# Patient Record
Sex: Female | Born: 1975 | Race: White | Hispanic: No | Smoking: Former smoker
Health system: Southern US, Community
[De-identification: ages and names within clinical notes are randomized; demographics above are authoritative.]

## PROBLEM LIST (undated history)

## (undated) DIAGNOSIS — Z8669 Personal history of other diseases of the nervous system and sense organs: Secondary | ICD-10-CM

## (undated) DIAGNOSIS — M539 Dorsopathy, unspecified: Secondary | ICD-10-CM

## (undated) DIAGNOSIS — D473 Essential (hemorrhagic) thrombocythemia: Secondary | ICD-10-CM

## (undated) HISTORY — PX: ABDOMINAL SURGERY: SHX537

## (undated) HISTORY — PX: PARTIAL HYSTERECTOMY: SHX80

## (undated) HISTORY — PX: ABDOMINAL HYSTERECTOMY: SHX81

## (undated) HISTORY — PX: KNEE SURGERY: SHX244

## (undated) HISTORY — DX: Personal history of other diseases of the nervous system and sense organs: Z86.69

## (undated) HISTORY — PX: TUBAL LIGATION: SHX77

## (undated) HISTORY — PX: APPENDECTOMY: SHX54

## (undated) HISTORY — PX: BACK SURGERY: SHX140

---
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
## 2004-02-23 ENCOUNTER — Emergency Department (HOSPITAL_COMMUNITY): Admission: EM | Admit: 2004-02-23 | Discharge: 2004-02-23 | Payer: Self-pay | Admitting: Emergency Medicine

## 2004-03-04 ENCOUNTER — Emergency Department (HOSPITAL_COMMUNITY): Admission: EM | Admit: 2004-03-04 | Discharge: 2004-03-04 | Payer: Self-pay | Admitting: *Deleted

## 2004-03-07 ENCOUNTER — Ambulatory Visit (HOSPITAL_COMMUNITY): Admission: RE | Admit: 2004-03-07 | Discharge: 2004-03-07 | Payer: Self-pay | Admitting: Emergency Medicine

## 2004-03-16 ENCOUNTER — Emergency Department (HOSPITAL_COMMUNITY): Admission: EM | Admit: 2004-03-16 | Discharge: 2004-03-16 | Payer: Self-pay | Admitting: Emergency Medicine

## 2004-03-19 ENCOUNTER — Ambulatory Visit (HOSPITAL_COMMUNITY): Admission: AD | Admit: 2004-03-19 | Discharge: 2004-03-20 | Payer: Self-pay | Admitting: Neurosurgery

## 2005-01-03 ENCOUNTER — Ambulatory Visit: Payer: Self-pay | Admitting: Sports Medicine

## 2006-01-03 ENCOUNTER — Emergency Department (HOSPITAL_COMMUNITY): Admission: EM | Admit: 2006-01-03 | Discharge: 2006-01-03 | Payer: Self-pay | Admitting: Emergency Medicine

## 2006-01-04 ENCOUNTER — Ambulatory Visit: Payer: Self-pay | Admitting: Family Medicine

## 2006-01-06 ENCOUNTER — Ambulatory Visit: Payer: Self-pay | Admitting: Family Medicine

## 2006-01-10 ENCOUNTER — Encounter: Admission: RE | Admit: 2006-01-10 | Discharge: 2006-01-10 | Payer: Self-pay | Admitting: Sports Medicine

## 2006-01-12 ENCOUNTER — Ambulatory Visit: Payer: Self-pay | Admitting: Family Medicine

## 2006-03-07 ENCOUNTER — Inpatient Hospital Stay (HOSPITAL_COMMUNITY): Admission: AD | Admit: 2006-03-07 | Discharge: 2006-03-09 | Payer: Self-pay | Admitting: Neurosurgery

## 2006-11-09 DIAGNOSIS — M545 Low back pain, unspecified: Secondary | ICD-10-CM | POA: Insufficient documentation

## 2006-11-09 DIAGNOSIS — F17200 Nicotine dependence, unspecified, uncomplicated: Secondary | ICD-10-CM

## 2006-11-09 DIAGNOSIS — N920 Excessive and frequent menstruation with regular cycle: Secondary | ICD-10-CM

## 2008-05-13 ENCOUNTER — Emergency Department (HOSPITAL_COMMUNITY): Admission: EM | Admit: 2008-05-13 | Discharge: 2008-05-13 | Payer: Self-pay | Admitting: Emergency Medicine

## 2008-05-14 ENCOUNTER — Telehealth: Payer: Self-pay | Admitting: *Deleted

## 2008-05-16 ENCOUNTER — Encounter (INDEPENDENT_AMBULATORY_CARE_PROVIDER_SITE_OTHER): Payer: Self-pay | Admitting: Family Medicine

## 2008-05-16 ENCOUNTER — Ambulatory Visit: Payer: Self-pay | Admitting: Family Medicine

## 2008-05-21 ENCOUNTER — Encounter (INDEPENDENT_AMBULATORY_CARE_PROVIDER_SITE_OTHER): Payer: Self-pay | Admitting: Family Medicine

## 2008-05-21 ENCOUNTER — Encounter: Admission: RE | Admit: 2008-05-21 | Discharge: 2008-05-21 | Payer: Self-pay | Admitting: Family Medicine

## 2008-05-22 ENCOUNTER — Encounter: Payer: Self-pay | Admitting: *Deleted

## 2008-05-30 ENCOUNTER — Encounter (INDEPENDENT_AMBULATORY_CARE_PROVIDER_SITE_OTHER): Payer: Self-pay | Admitting: Family Medicine

## 2008-06-02 ENCOUNTER — Encounter: Admission: RE | Admit: 2008-06-02 | Discharge: 2008-06-02 | Payer: Self-pay | Admitting: Neurosurgery

## 2008-06-13 ENCOUNTER — Encounter: Admission: RE | Admit: 2008-06-13 | Discharge: 2008-06-13 | Payer: Self-pay | Admitting: Neurosurgery

## 2008-06-26 ENCOUNTER — Encounter: Admission: RE | Admit: 2008-06-26 | Discharge: 2008-06-26 | Payer: Self-pay | Admitting: Neurosurgery

## 2008-07-28 ENCOUNTER — Ambulatory Visit: Payer: Self-pay | Admitting: Family Medicine

## 2008-07-28 ENCOUNTER — Telehealth: Payer: Self-pay | Admitting: *Deleted

## 2008-07-28 ENCOUNTER — Encounter: Payer: Self-pay | Admitting: Family Medicine

## 2008-07-28 DIAGNOSIS — J069 Acute upper respiratory infection, unspecified: Secondary | ICD-10-CM | POA: Insufficient documentation

## 2008-07-28 DIAGNOSIS — H6120 Impacted cerumen, unspecified ear: Secondary | ICD-10-CM | POA: Insufficient documentation

## 2009-08-04 ENCOUNTER — Emergency Department (HOSPITAL_COMMUNITY): Admission: EM | Admit: 2009-08-04 | Discharge: 2009-08-04 | Payer: Self-pay | Admitting: Emergency Medicine

## 2009-11-10 ENCOUNTER — Encounter: Payer: Self-pay | Admitting: Family Medicine

## 2010-10-12 NOTE — Miscellaneous (Signed)
Summary: Clean up old acute medications  Clinical Lists Changes  Medications: Removed medication of HYDROCODONE-ACETAMINOPHEN 5-325 MG TABS (HYDROCODONE-ACETAMINOPHEN) Take 1-2 tabs every 4-6 hours as needed pain. Removed medication of IBU 800 MG TABS (IBUPROFEN) Take one tablet every 8 hours until seen by neurosugery. Removed medication of FLEXERIL 5 MG TABS (CYCLOBENZAPRINE HCL) Take 1-2 tablets every 8 hours as needed muscle spasm for muscle relaxation. Do not drive or do anything dangerous if taking this.

## 2011-01-28 NOTE — Op Note (Signed)
NAME:  Brandi Durham, Brandi Durham                          ACCOUNT NO.:  [id number]   MEDICAL RECORD NO.:  [id number]                   PATIENT TYPE:  OIB   LOCATION:  3015                                 FACILITY:  [facility]   PHYSICIAN:  Coletta Memos, M.D.                  DATE OF BIRTH:  Jun 19, 1976   DATE OF PROCEDURE:  03/19/2004  DATE OF DISCHARGE:  03/20/2004                                 OPERATIVE REPORT   PREOPERATIVE DIAGNOSIS:  Displaced disk, L5-S1; left S1 radiculopathy.   POSTOPERATIVE DIAGNOSIS:  Displaced disk, L5-S1; left S1 radiculopathy.   PROCEDURE:  Left L5-S1 diskectomy with microdissection.   COMPLICATIONS:  None.   SURGEON:  Coletta Memos, M.D.   ASSISTANT:  Hilda Lias, M.D.   INDICATIONS:  Brandi Durham presented to my office today for evaluation of  severe pain in the left lower extremity.  MRI showed a large herniated disk  at L5-S1.  I therefore recommended and she agreed to undergo operative  decompression.   OPERATIVE NOTE:  Brandi Durham was brought to the operating room, intubated and  placed under a general anesthetic without difficulty.  She was rolled prone  onto a Wilson frame and all pressure points were properly padded.  The skin  was prepped and she was draped in a sterile fashion.  She had a large tattoo  overlying the area of incision.  I placed a spinal needle to localize my  incision.  Once that was done, I infiltrated 0.5% lidocaine with 1:200,000-  strength epinephrine.  I opened the skin with a #10 blade and took this down  to the thoracolumbar fascia sharply.  I then exposed the lamina of what I  believed to be L5 and S1.  I took an x-ray and I used that to localize the  L5-S1 interspace.  I then removed the ligamentum flavum and performed a very  slight semi-hemilaminectomy using a Kerrison punch.  I retracted the thecal  sac medially, identified a disk herniation and brought in the microscope to  aid in microdissection.  With Dr. Cassandria Santee  assistance, we removed the disk  in a progressive fashion using pituitary rongeurs, curettes and Kerrison  punches.  After decompressing the nerve root to my satisfaction, I then  inspected the egress of the S1 nerve root,  rostrally, caudally, laterally  and medially.  I felt that there was no more pressure.  I then irrigated the  wound.  I then closed the wound in layered fashion using Vicryl sutures.  Dermabond was used for a sterile dressing.  Brandi Durham tolerated the  procedure well without difficulty, moving all extremities upon awakening.  Coletta Memos, M.D.    KC/MEDQ  D:  03/30/2004  T:  03/31/2004  Job:  [id number]

## 2011-01-28 NOTE — Discharge Summary (Signed)
[name], [name]                ACCOUNT NO.:  [id number]   MEDICAL RECORD NO.:  [id number]          PATIENT TYPE:  INP   LOCATION:  3004                         FACILITY:  [facility]   PHYSICIAN:  Coletta Memos, M.D.     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   PROCEDURE:  Left L5-S1 redo semi-hemilaminectomy and diskectomy with  microdissection.   COMPLICATIONS:  Dural tear.   DISCHARGE STATUS:  Alive and well.   INDICATIONS:  Brandi Durham is a patient of mine, who I had taken a disc out  of on the left side at L5-S1 in 2005.  She came back and had recurrent pain  in the left lower extremity.  MRI showed recurrent disc.  Her operation went  well.  She did have a dural tear.  Postoperatively she was flat in bed for 2  days.  She had no problems with bowel movements or with urination.  Her  wound was clean, flat and dry.  She was therefore discharged home on given  instructions.           ______________________________  Coletta Memos, M.D.     KC/MEDQ  D:  04/07/2006  T:  04/07/2006  Job:  [id number]

## 2011-01-28 NOTE — Op Note (Signed)
NAMECAMYA, HAYDON                ACCOUNT NO.:  [id number]   MEDICAL RECORD NO.:  [id number]          PATIENT TYPE:  OIB   LOCATION:  3172                         FACILITY:  [facility]   PHYSICIAN:  Coletta Memos, M.D.     DATE OF BIRTH:  03/04/1976   DATE OF PROCEDURE:  03/06/2006  DATE OF DISCHARGE:                                 OPERATIVE REPORT   PREOPERATIVE DIAGNOSIS:  1.  Recurrent disk herniation L5-S1.  2.  Left lower extremity numbness and pain.   POSTOPERATIVE DIAGNOSIS:  1.  Recurrent disk herniation L5-S1.  2.  Left lower extremity numbness and pain.   PROCEDURE:  Left L5-S1, semihemilaminectomy and diskectomy with  microdissection.   COMPLICATIONS:  Unintentional dural opening.   INDICATIONS:  Mrs. Marcine Katrinka Durham is a 35 year old who underwent a lumbar  procedure in the past approximately 2 years ago for herniated disk.  She has  done very well since that time but then started having pain a few months  ago.  As a result of that, she had a repeat MRI scan, and what the scan  showed was that she had recurrent disk at L5-S1.  She therefore wanted to  undergo decompression again.  I agreed and she is admitted today for  operation.   OPERATIVE NOTE:  Brandi Durham was brought to the operating room intubated and  placed under general anesthetic.  She was rolled prone onto a Wilson frame  and all pressure points were properly padded.  Back was prepped.  She was  draped in a sterile fashion.  I infiltrated lidocaine into the old incision  site.  I opened the incision with a #10 blade.  I took this down to the  thoracolumbar fascia.  I then exposed lamina of L5-S1.  I took an x-ray  after placing a double ended ganglion knife inferior to the superior lamina  and my exposure and I was at L5-S1.  I then used a curette to separate the  scar tissue from the lamina of L5.  I did that without great difficulty  initially.  I then placed a Kerrison punch and took a few bites and  everything was fine.  I then used a smaller punch to get underneath some  scar tissue and when I bit at that point time I had spinal fluid.  I was  able to see where an opening was in the scar tissue but I could not see any  dissection between scar and dura.  As that was high on the canal, I was able  to just place a flap of scar over that and continue working.  I eventually  dissected laterally using curved curettes and to drill along with Kerrison  punches until I thought I was at the disk space.  I therefore took another x-  ray ad it showed that my probe was pointing directly over the disk space.  I  used Penfield #4 to the disk space and then proceeded with a diskectomy.  I  removed a significant amount of material but the disk was degenerated.  When  I was done with the diskectomy using microdissection and with Dr. Verlee Rossetti  assistance, I inspected the nerve root on that side.  The S1 nerve root was  intact and felt decompressed.  Much of this was in the midline.  I then cut  a piece of paraspinous musculature, placed that over the opening into the  dura.  I then showed the flap down to the other scar tissue with the muscle  underneath covering the opening and secured that with 6-0 Prolene.  At that  point in time, I then used  Tisseel and covered that area, then I placed a piece of subcutaneous fat  over my repair site and then used more Tisseel.  At that point in time, I  closed the wound in layered fashion using Vicryl sutures.  Dermabond was  used for sterile dressing.  Brandi Durham tolerated the procedure well.           ______________________________  Coletta Memos, M.D.     KC/MEDQ  D:  03/06/2006  T:  03/06/2006  Job:  [id number]

## 2012-03-07 ENCOUNTER — Emergency Department (HOSPITAL_COMMUNITY)
Admission: EM | Admit: 2012-03-07 | Discharge: 2012-03-07 | Disposition: A | Discharge status: Home / Self Care | Payer: Self-pay | Attending: Emergency Medicine | Admitting: Emergency Medicine

## 2012-03-07 ENCOUNTER — Encounter (HOSPITAL_COMMUNITY): Payer: Self-pay | Admitting: Emergency Medicine

## 2012-03-07 DIAGNOSIS — M549 Dorsalgia, unspecified: Secondary | ICD-10-CM

## 2012-03-07 DIAGNOSIS — F17200 Nicotine dependence, unspecified, uncomplicated: Secondary | ICD-10-CM | POA: Insufficient documentation

## 2012-03-07 HISTORY — DX: Dorsopathy, unspecified: M53.9

## 2012-03-07 MED ORDER — OXYCODONE-ACETAMINOPHEN 5-325 MG PO TABS
1.0000 | ORAL_TABLET | Freq: Once | ORAL | Status: AC
  Administered 2012-03-07: 1 via ORAL
  Filled 2012-03-07: qty 1

## 2012-03-07 MED ORDER — PREDNISONE 20 MG PO TABS
60.0000 mg | ORAL_TABLET | Freq: Once | ORAL | Status: AC
  Administered 2012-03-07: 60 mg via ORAL
  Filled 2012-03-07: qty 3

## 2012-03-07 MED ORDER — OXYCODONE-ACETAMINOPHEN 5-325 MG PO TABS: 1.0000 | ORAL_TABLET | ORAL | Status: AC | PRN

## 2012-03-07 MED ORDER — PREDNISONE 50 MG PO TABS: 50.0000 mg | ORAL_TABLET | Freq: Every day | ORAL | Status: DC

## 2012-03-07 NOTE — ED Notes (Signed)
Pt reports recurrent low back pain Swollen tender area on spine in mid back

## 2012-03-07 NOTE — ED Provider Notes (Signed)
History     CSN: [id number]  Arrival date & time 03/07/12  1002   First MD Initiated Contact with Patient 03/07/12 1043      Chief Complaint  Patient presents with  . Back Pain    hx of recurrent low back pain  . Abscess    soft, swollen, area on mid back.  Approx. 2 cm diameter. Tenderness and pain x 2 weeks    (Consider location/radiation/quality/duration/timing/severity/associated sxs/prior treatment) Patient is a 36 y.o. female presenting with back pain. The history is provided by the patient. No language interpreter was used.  Back Pain  This is a chronic problem. The current episode started more than 1 week ago. The problem occurs constantly. The problem has been rapidly worsening. The pain is present in the thoracic spine and lumbar spine. The quality of the pain is described as aching. The pain radiates to the right thigh. The pain is at a severity of 8/10. The pain is severe. The symptoms are aggravated by bending and twisting. The pain is worse during the night (Affects sleep.). Associated symptoms include paresthesias. Pertinent negatives include no fever and no weakness. Associated symptoms comments: Paraesthesias affect the right leg.. She has tried muscle relaxants and NSAIDs (Muscle relaxants were given by her friend.) for the symptoms. The treatment provided mild relief.    Past Medical History  Diagnosis Date  . Back problem     Past Surgical History  Procedure Date  . Back surgery   . Abdominal surgery   . Knee surgery   . Cesarean section   . Abdominal hysterectomy   . Partial hysterectomy     Family History  Problem Relation Age of Onset  . Diabetes Mother   . Hypertension Mother   . Heart failure Father     History  Substance Use Topics  . Smoking status: Current Some Day Smoker  . Smokeless tobacco: Not on file  . Alcohol Use: Yes    OB History    Grav Para Term Preterm Abortions TAB SAB Ect Mult Living                  Review of Systems   Constitutional: Negative for fever and chills.  Genitourinary: Negative for difficulty urinating.       Negative for bowel or bladder incontinence.  Musculoskeletal: Positive for back pain.  Neurological: Positive for paresthesias. Negative for weakness.  Psychiatric/Behavioral: Positive for disturbed wake/sleep cycle.       Disturbance secondary to back pain.    Allergies  Penicillins and Sulfur  Home Medications   Current Outpatient Rx  Name Route Sig Dispense Refill  . IBUPROFEN 800 MG PO TABS Oral Take 800 mg by mouth every 4 (four) hours as needed. Back pain    . METHOCARBAMOL 500 MG PO TABS Oral Take 500 mg by mouth once. Back pain      BP 107/84  Pulse 65  Temp 98.1 F (36.7 C) (Oral)  Resp 18  SpO2 100%  LMP 03/06/2012  Physical Exam  Constitutional: She appears well-developed and well-nourished. She appears distressed.       Patient was tearful after questioning.  HENT:  Head: Normocephalic and atraumatic.  Neck: Normal range of motion. Neck supple.  Cardiovascular: Normal rate and regular rhythm.   Pulmonary/Chest: Effort normal and breath sounds normal.  Abdominal: Soft. There is no tenderness.  Musculoskeletal: She exhibits edema and tenderness.       Right shoulder: She exhibits tenderness, deformity and  pain.       Arms:      Tenderness on palpation of the cervical, thoracic, and lumbar spine. 2.5-3cm tender, non-indurated mass noted on the thoracic area of the spine.   Neurological: She has normal reflexes. She exhibits abnormal muscle tone.       Abnormal tone noted in the right foot and toes.    ED Course  Procedures (including critical care time)  Labs Reviewed - No data to display No results found.   1. Back pain       MDM  Chronic musculoskeletal pain exacerbation: Patient will be discharged on Percocet and Prednisone and follow-up with her neurosurgeon.No red flags to suggest cauda equina.  Thoracic mass/swelling needs to be  evaluated by PCP in the next week. No induration or fluctuation to suggest abscess.        Pixie Casino, PA-C 03/10/12 1054

## 2012-03-07 NOTE — Discharge Instructions (Signed)
Take Oxycodone and Prednisone as perscribed. Follow-up with Neurosurgeon for back pain. Follow-up with primary care about mass on spine. Do not take medications not prescribed by your physician.

## 2012-03-16 NOTE — ED Provider Notes (Signed)
Patient with soft tissue swelling midback without erythema or fluctuance most c.e. Lipoma, also complaining of low back pain with normal neuro exam.   History/physical exam/procedure(s) were performed by non-physician practitioner and as supervising physician I was immediately available for consultation/collaboration. I have reviewed all notes and am in agreement with care and plan.   Hilario Quarry, MD 03/16/12 0400

## 2012-04-18 ENCOUNTER — Encounter (HOSPITAL_COMMUNITY): Payer: Self-pay | Admitting: Emergency Medicine

## 2012-04-18 ENCOUNTER — Emergency Department (HOSPITAL_COMMUNITY)
Admission: EM | Admit: 2012-04-18 | Discharge: 2012-04-19 | Disposition: A | Discharge status: Home / Self Care | Payer: Self-pay | Attending: Emergency Medicine | Admitting: Emergency Medicine

## 2012-04-18 DIAGNOSIS — M549 Dorsalgia, unspecified: Secondary | ICD-10-CM | POA: Insufficient documentation

## 2012-04-18 DIAGNOSIS — F17200 Nicotine dependence, unspecified, uncomplicated: Secondary | ICD-10-CM | POA: Insufficient documentation

## 2012-04-18 NOTE — ED Notes (Signed)
Pt alert, arrives from home, c/o low back pain, onset was chronic in nature, recent injury at work, slip fall injury, resp even unlabored, skin pwd, ambulates to triage, steady gait noted

## 2012-04-19 MED ORDER — OXYCODONE-ACETAMINOPHEN 5-325 MG PO TABS: 1.0000 | ORAL_TABLET | Freq: Four times a day (QID) | ORAL | Status: AC | PRN

## 2012-04-19 MED ORDER — METHOCARBAMOL 500 MG PO TABS: 1000.0000 mg | ORAL_TABLET | Freq: Four times a day (QID) | ORAL | Status: DC

## 2012-04-19 MED ORDER — PREDNISONE 20 MG PO TABS: ORAL_TABLET | ORAL | Status: AC

## 2012-04-19 MED ORDER — IBUPROFEN 800 MG PO TABS: 800.0000 mg | ORAL_TABLET | Freq: Three times a day (TID) | ORAL | Status: AC | PRN

## 2012-04-19 MED ORDER — OXYCODONE-ACETAMINOPHEN 5-325 MG PO TABS
2.0000 | ORAL_TABLET | Freq: Once | ORAL | Status: AC
  Administered 2012-04-19: 2 via ORAL
  Filled 2012-04-19: qty 2

## 2012-04-19 NOTE — ED Provider Notes (Signed)
History     CSN: [id number]  Arrival date & time 04/18/12  2241   First MD Initiated Contact with Patient 04/18/12 2354      Chief Complaint  Patient presents with  . Back Pain    (Consider location/radiation/quality/duration/timing/severity/associated sxs/prior treatment) HPI Comments: History of chronic back pain, previous surgery by Dr. Franky Macho -- presents with flare of chronic pain since last week when she slipped and fell. Took percocet which helped her sleep. Tylenol and ibuprofen have not helped. Radiation of pain into R leg. No red flag s/s of low back pain. Onset was acute. Course is constant. Movement and certain positions make the pain worse. Percocet improves the pain mildly. Pain medication, NSAIDs, steroids have helped her symptoms in the past.   Patient is a 36 y.o. female presenting with back pain. The history is provided by the patient.  Back Pain  This is a chronic problem. The current episode started more than 2 days ago. The problem occurs constantly. The problem has not changed since onset.The pain is associated with falling. The pain is present in the lumbar spine. The quality of the pain is described as shooting. The pain radiates to the right thigh. The pain is moderate. The symptoms are aggravated by bending, twisting and certain positions. Associated symptoms include leg pain. Pertinent negatives include no fever, no numbness, no weight loss, no bowel incontinence, no bladder incontinence, no dysuria, no pelvic pain, no tingling and no weakness. She has tried analgesics and NSAIDs for the symptoms. The treatment provided mild relief.    Past Medical History  Diagnosis Date  . Back problem     Past Surgical History  Procedure Date  . Back surgery   . Abdominal surgery   . Knee surgery   . Cesarean section   . Abdominal hysterectomy   . Partial hysterectomy     Family History  Problem Relation Age of Onset  . Diabetes Mother   . Hypertension Mother   .  Heart failure Father     History  Substance Use Topics  . Smoking status: Current Some Day Smoker  . Smokeless tobacco: Not on file  . Alcohol Use: Yes    OB History    Grav Para Term Preterm Abortions TAB SAB Ect Mult Living                  Review of Systems  Constitutional: Negative for fever, weight loss and unexpected weight change.  Gastrointestinal: Negative for constipation and bowel incontinence.       Negative for fecal incontinence.   Genitourinary: Negative for bladder incontinence, dysuria, hematuria, flank pain, vaginal bleeding, vaginal discharge and pelvic pain.       Negative for urinary incontinence or retention.  Musculoskeletal: Positive for back pain.  Neurological: Negative for tingling, weakness and numbness.       Denies saddle paresthesias.    Allergies  Penicillins and Sulfur  Home Medications   Current Outpatient Rx  Name Route Sig Dispense Refill  . IBUPROFEN 800 MG PO TABS Oral Take 800 mg by mouth every 4 (four) hours as needed. Back pain      BP 111/73  Pulse 95  Temp 98.4 F (36.9 C) (Oral)  Resp 20  SpO2 96%  LMP 04/08/2012  Physical Exam  Nursing note and vitals reviewed. Constitutional: She appears well-developed and well-nourished.  HENT:  Head: Normocephalic and atraumatic.  Eyes: Conjunctivae are normal.  Neck: Normal range of motion. Neck supple.  Pulmonary/Chest: Effort normal.  Abdominal: Soft. There is no tenderness. There is no CVA tenderness.  Musculoskeletal: Normal range of motion.       Cervical back: She exhibits no tenderness and no bony tenderness.       Thoracic back: She exhibits no tenderness and no bony tenderness.       Lumbar back: She exhibits tenderness. She exhibits normal range of motion and no bony tenderness.       Back:       Healed, midline surgical scar noted over her lumbar spine. No step-off noted with palpation of spine.   Neurological: She is alert. She has normal strength and normal  reflexes. No sensory deficit.       5/5 strength in entire lower extremities bilaterally. No sensation deficit.   Skin: Skin is warm and dry. No rash noted.  Psychiatric: She has a normal mood and affect.    ED Course  Procedures (including critical care time)  Labs Reviewed - No data to display No results found.   1. Back pain     12:29 AM Patient seen and examined. Medications ordered.   Vital signs reviewed and are as follows: Filed Vitals:   04/18/12 2251  BP: 111/73  Pulse: 95  Temp: 98.4 F (36.9 C)  Resp: 20   No red flag s/s of low back pain. Patient was counseled on back pain precautions and told to do activity as tolerated but do not lift, push, or pull heavy objects more than 10 pounds for the next week.  Patient counseled to use ice or heat on back for no longer than 15 minutes every hour.   Patient prescribed muscle relaxer and counseled on proper use of muscle relaxant medication.    Patient prescribed narcotic pain medicine and counseled on proper use of narcotic pain medications. Counseled not to combine this medication with others containing tylenol.   Urged patient not to drink alcohol, drive, or perform any other activities that requires focus while taking either of these medications.  Patient urged to follow-up with PCP if pain does not improve with treatment and rest or if pain becomes recurrent. Urged to return with worsening severe pain, loss of bowel or bladder control, trouble walking.   The patient verbalizes understanding and agrees with the plan.   MDM  Patient with back pain. No neurological deficits. Patient is ambulatory. No warning symptoms of back pain including: loss of bowel or bladder control, night sweats, waking from sleep with back pain, unexplained fevers or weight loss, h/o cancer, IVDU, recent trauma. No concern for cauda equina, epidural abscess, or other serious cause of back pain. Conservative measures such as rest, ice/heat and  pain medicine indicated with PCP follow-up if no improvement with conservative management.          Renne Crigler, [place] 04/19/12 [phone]

## 2012-04-20 NOTE — ED Provider Notes (Signed)
Medical screening examination/treatment/procedure(s) were performed by non-physician practitioner and as supervising physician I was immediately available for consultation/collaboration.  Aaron Bostwick, MD 04/20/12 0853 

## 2012-08-01 ENCOUNTER — Encounter (HOSPITAL_COMMUNITY): Payer: Self-pay | Admitting: *Deleted

## 2012-08-01 ENCOUNTER — Emergency Department (HOSPITAL_COMMUNITY)
Admission: EM | Admit: 2012-08-01 | Discharge: 2012-08-01 | Disposition: A | Discharge status: Home / Self Care | Payer: Self-pay | Attending: Emergency Medicine | Admitting: Emergency Medicine

## 2012-08-01 DIAGNOSIS — G479 Sleep disorder, unspecified: Secondary | ICD-10-CM | POA: Insufficient documentation

## 2012-08-01 DIAGNOSIS — R209 Unspecified disturbances of skin sensation: Secondary | ICD-10-CM | POA: Insufficient documentation

## 2012-08-01 DIAGNOSIS — M545 Low back pain, unspecified: Secondary | ICD-10-CM | POA: Insufficient documentation

## 2012-08-01 DIAGNOSIS — M5380 Other specified dorsopathies, site unspecified: Secondary | ICD-10-CM | POA: Insufficient documentation

## 2012-08-01 DIAGNOSIS — F17200 Nicotine dependence, unspecified, uncomplicated: Secondary | ICD-10-CM | POA: Insufficient documentation

## 2012-08-01 DIAGNOSIS — G8929 Other chronic pain: Secondary | ICD-10-CM | POA: Insufficient documentation

## 2012-08-01 DIAGNOSIS — M549 Dorsalgia, unspecified: Secondary | ICD-10-CM

## 2012-08-01 MED ORDER — CYCLOBENZAPRINE HCL 10 MG PO TABS: 10.0000 mg | ORAL_TABLET | Freq: Two times a day (BID) | ORAL | Status: DC | PRN

## 2012-08-01 MED ORDER — OXYCODONE-ACETAMINOPHEN 5-325 MG PO TABS
2.0000 | ORAL_TABLET | Freq: Once | ORAL | Status: AC
  Administered 2012-08-01: 2 via ORAL
  Filled 2012-08-01: qty 2

## 2012-08-01 MED ORDER — PREDNISONE 20 MG PO TABS: ORAL_TABLET | ORAL | Status: DC

## 2012-08-01 NOTE — ED Notes (Signed)
Pt has ride home.

## 2012-08-01 NOTE — ED Provider Notes (Signed)
Medical screening examination/treatment/procedure(s) were performed by non-physician practitioner and as supervising physician I was immediately available for consultation/collaboration.  Ethelda Chick, MD 08/01/12 2036

## 2012-08-01 NOTE — ED Provider Notes (Signed)
History     CSN: [id number]  Arrival date & time 08/01/12  [place]   First MD Initiated Contact with Patient 08/01/12 1921      Chief Complaint  Patient presents with  . Back Pain    (Consider location/radiation/quality/duration/timing/severity/associated sxs/prior treatment) HPI Comments: 36 year old female presents the emergency department complaining of back pain across her entire lower back over the past few weeks worsening over the past couple days. She has a history of chronic back pain and two back surgeries one being in 2006 and the other 2009 by Dr. Mikal Plane. She'll longer sees Dr. Mikal Plane due to insurance reasons. Pain rated 10 out of 10, radiating down her right leg. Pain worse when she stands for long period of time. Admits to associated numbness and tingling down both of her legs. Denies loss of control of bowels or bladder or saddle anesthesia. States she's having difficulty falling asleep this cannot be comfortable. She's been trying to take Flexeril without any relief. No other alleviating factors have been tried. Also complaining of an "soft lump" that she noticed a couple days ago in the middle of the fact that is beginning to cause her pain. Denies any injury or trauma.  The history is provided by the patient.    Past Medical History  Diagnosis Date  . Back problem     Past Surgical History  Procedure Date  . Back surgery   . Abdominal surgery   . Knee surgery   . Cesarean section   . Abdominal hysterectomy   . Partial hysterectomy     Family History  Problem Relation Age of Onset  . Diabetes Mother   . Hypertension Mother   . Heart failure Father     History  Substance Use Topics  . Smoking status: Current Some Day Smoker  . Smokeless tobacco: Not on file  . Alcohol Use: Yes    OB History    Grav Para Term Preterm Abortions TAB SAB Ect Mult Living                  Review of Systems  Gastrointestinal: Negative.   Genitourinary: Negative.     Musculoskeletal: Positive for back pain.  Skin: Negative for color change.  Neurological: Positive for numbness.    Allergies  Penicillins and Sulfur  Home Medications   Current Outpatient Rx  Name  Route  Sig  Dispense  Refill  . CYCLOBENZAPRINE HCL 10 MG PO TABS   Oral   Take 10 mg by mouth 3 (three) times daily as needed. Muscle spasms           BP 117/77  Pulse 89  Temp 99 F (37.2 C) (Oral)  Resp 17  Ht 5\' 1"  (1.549 m)  Wt 140 lb (63.504 kg)  BMI 26.45 kg/m2  SpO2 99%  LMP 08/01/2012  Physical Exam  Nursing note and vitals reviewed. Constitutional: She is oriented to person, place, and time. She appears well-developed and well-nourished. No distress.       Groaning of pain  HENT:  Head: Normocephalic and atraumatic.  Eyes: Conjunctivae normal and EOM are normal. Pupils are equal, round, and reactive to light.  Neck: Normal range of motion. Neck supple.  Cardiovascular: Normal rate, regular rhythm, normal heart sounds and intact distal pulses.   Pulmonary/Chest: Effort normal and breath sounds normal. No respiratory distress.  Abdominal: Soft. Bowel sounds are normal.  Musculoskeletal:       Lumbar back: She exhibits decreased range of motion (  due to pain).       Arms:      Negative straight leg raise bilateral  Neurological: She is alert and oriented to person, place, and time. She has normal strength. No sensory deficit.  Skin: Skin is warm, dry and intact.     Psychiatric: Her speech is normal. Her mood appears anxious. She is is hyperactive.    ED Course  Procedures (including critical care time)  Labs Reviewed - No data to display No results found.   1. Back pain   2. Chronic back pain       MDM  36 year old female with chronic back pain. No red flags concerning patient's back pain. No s/s of central cord compression or cauda equina. Lower extremities are neurovascularly intact and patient is ambulating without difficulty. I discussed  prednisone and Flexeril the patient, and her apply was "I was only given prednisone the last time he was seen in the did not help". When looking back in her chart she was prescribed pain medication along with the prednisone. 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 I advised her to followup with Dr. Mikal Plane. We'll manage her pain in the emergency department, she'll be discharged with prednisone and Flexeril.   Trevor Mace, PA-C 08/01/12 2033

## 2012-08-01 NOTE — ED Notes (Signed)
Pt ambulatory to exam room with steady gait. Pt denies trauma to back. Pt admits to prior back surgeries in 2006 and 2009.

## 2012-08-01 NOTE — ED Notes (Signed)
Pt c/o back pain that started a few weeks ago, denies any other symptoms, sts shes currently on her period, denies any injuries

## 2012-11-09 ENCOUNTER — Emergency Department (HOSPITAL_COMMUNITY)
Admission: EM | Admit: 2012-11-09 | Discharge: 2012-11-09 | Disposition: A | Discharge status: Home / Self Care | Payer: No Typology Code available for payment source | Attending: Emergency Medicine | Admitting: Emergency Medicine

## 2012-11-09 ENCOUNTER — Encounter (HOSPITAL_COMMUNITY): Payer: Self-pay | Admitting: *Deleted

## 2012-11-09 DIAGNOSIS — S139XXA Sprain of joints and ligaments of unspecified parts of neck, initial encounter: Secondary | ICD-10-CM | POA: Insufficient documentation

## 2012-11-09 DIAGNOSIS — Z8739 Personal history of other diseases of the musculoskeletal system and connective tissue: Secondary | ICD-10-CM | POA: Insufficient documentation

## 2012-11-09 DIAGNOSIS — M549 Dorsalgia, unspecified: Secondary | ICD-10-CM

## 2012-11-09 DIAGNOSIS — S161XXA Strain of muscle, fascia and tendon at neck level, initial encounter: Secondary | ICD-10-CM

## 2012-11-09 DIAGNOSIS — Y9389 Activity, other specified: Secondary | ICD-10-CM | POA: Insufficient documentation

## 2012-11-09 DIAGNOSIS — F17200 Nicotine dependence, unspecified, uncomplicated: Secondary | ICD-10-CM | POA: Insufficient documentation

## 2012-11-09 DIAGNOSIS — D1779 Benign lipomatous neoplasm of other sites: Secondary | ICD-10-CM | POA: Insufficient documentation

## 2012-11-09 DIAGNOSIS — M545 Low back pain, unspecified: Secondary | ICD-10-CM

## 2012-11-09 DIAGNOSIS — Y92410 Unspecified street and highway as the place of occurrence of the external cause: Secondary | ICD-10-CM | POA: Insufficient documentation

## 2012-11-09 DIAGNOSIS — M62838 Other muscle spasm: Secondary | ICD-10-CM | POA: Insufficient documentation

## 2012-11-09 DIAGNOSIS — M62830 Muscle spasm of back: Secondary | ICD-10-CM

## 2012-11-09 MED ORDER — METHOCARBAMOL 750 MG PO TABS: 750.0000 mg | ORAL_TABLET | Freq: Four times a day (QID) | ORAL | Status: DC | PRN

## 2012-11-09 MED ORDER — NAPROXEN 500 MG PO TABS: 500.0000 mg | ORAL_TABLET | Freq: Two times a day (BID) | ORAL | Status: DC | PRN

## 2012-11-09 MED ORDER — HYDROCODONE-ACETAMINOPHEN 5-325 MG PO TABS: 1.0000 | ORAL_TABLET | Freq: Four times a day (QID) | ORAL | Status: DC | PRN

## 2012-11-09 MED ORDER — HYDROCODONE-ACETAMINOPHEN 5-325 MG PO TABS
2.0000 | ORAL_TABLET | Freq: Once | ORAL | Status: AC
  Administered 2012-11-09: 2 via ORAL
  Filled 2012-11-09: qty 2

## 2012-11-09 MED ORDER — DIAZEPAM 5 MG PO TABS
10.0000 mg | ORAL_TABLET | Freq: Once | ORAL | Status: AC
  Administered 2012-11-09: 10 mg via ORAL
  Filled 2012-11-09: qty 2
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
## 2012-11-09 NOTE — Progress Notes (Signed)
During [facility] ED 11/09/12 visit pt was seen by [facility] liaison  Pt offered services to assist with finding a [place] self pay provider, resources & health reform information Pt states she is seen by [facility] family practice providers and will start receiving benefits from her job soon after probationary period Believes the coverage will be blue cross and blue shield

## 2012-11-11 NOTE — ED Provider Notes (Signed)
Medical screening examination/treatment/procedure(s) were performed by non-physician practitioner and as supervising physician I was immediately available for consultation/collaboration.    Celene Kras, MD 11/11/12 [phone]

## 2013-11-20 ENCOUNTER — Other Ambulatory Visit: Payer: Self-pay | Admitting: Neurosurgery

## 2013-11-20 ENCOUNTER — Ambulatory Visit
Admission: RE | Admit: 2013-11-20 | Discharge: 2013-11-20 | Disposition: A | Discharge status: Home / Self Care | Payer: No Typology Code available for payment source | Source: Ambulatory Visit | Attending: Neurosurgery | Admitting: Neurosurgery

## 2013-11-20 DIAGNOSIS — M5126 Other intervertebral disc displacement, lumbar region: Secondary | ICD-10-CM

## 2013-11-20 MED ORDER — GADOBENATE DIMEGLUMINE 529 MG/ML IV SOLN
15.0000 mL | Freq: Once | INTRAVENOUS | Status: AC | PRN
  Administered 2013-11-20: 15 mL via INTRAVENOUS

## 2013-11-28 ENCOUNTER — Other Ambulatory Visit: Payer: Self-pay | Admitting: Neurosurgery

## 2013-11-28 DIAGNOSIS — M546 Pain in thoracic spine: Secondary | ICD-10-CM

## 2013-11-28 DIAGNOSIS — R229 Localized swelling, mass and lump, unspecified: Secondary | ICD-10-CM

## 2013-11-28 DIAGNOSIS — M542 Cervicalgia: Secondary | ICD-10-CM

## 2013-12-06 ENCOUNTER — Ambulatory Visit
Admission: RE | Admit: 2013-12-06 | Discharge: 2013-12-06 | Disposition: A | Discharge status: Home / Self Care | Payer: No Typology Code available for payment source | Source: Ambulatory Visit | Attending: Neurosurgery | Admitting: Neurosurgery

## 2013-12-06 DIAGNOSIS — M542 Cervicalgia: Secondary | ICD-10-CM

## 2013-12-06 DIAGNOSIS — M546 Pain in thoracic spine: Secondary | ICD-10-CM

## 2013-12-06 DIAGNOSIS — R229 Localized swelling, mass and lump, unspecified: Secondary | ICD-10-CM

## 2014-09-19 ENCOUNTER — Encounter (HOSPITAL_COMMUNITY): Payer: Self-pay | Admitting: *Deleted

## 2014-09-19 ENCOUNTER — Emergency Department (HOSPITAL_COMMUNITY)
Admission: EM | Admit: 2014-09-19 | Discharge: 2014-09-19 | Disposition: A | Discharge status: Home / Self Care | Payer: No Typology Code available for payment source | Attending: Emergency Medicine | Admitting: Emergency Medicine

## 2014-09-19 DIAGNOSIS — Z880 Allergy status to penicillin: Secondary | ICD-10-CM | POA: Insufficient documentation

## 2014-09-19 DIAGNOSIS — R519 Headache, unspecified: Secondary | ICD-10-CM

## 2014-09-19 DIAGNOSIS — Z720 Tobacco use: Secondary | ICD-10-CM | POA: Insufficient documentation

## 2014-09-19 DIAGNOSIS — R51 Headache: Secondary | ICD-10-CM | POA: Insufficient documentation

## 2014-09-19 MED ORDER — KETOROLAC TROMETHAMINE 30 MG/ML IJ SOLN
60.0000 mg | Freq: Once | INTRAMUSCULAR | Status: AC
  Administered 2014-09-19: 60 mg via INTRAMUSCULAR
  Filled 2014-09-19: qty 2

## 2014-09-19 MED ORDER — TETRACAINE HCL 0.5 % OP SOLN
1.0000 [drp] | Freq: Once | OPHTHALMIC | Status: AC
  Administered 2014-09-19: 1 [drp] via OPHTHALMIC
  Filled 2014-09-19: qty 2

## 2014-09-19 MED ORDER — METOCLOPRAMIDE HCL 5 MG/ML IJ SOLN
10.0000 mg | Freq: Once | INTRAMUSCULAR | Status: AC
  Administered 2014-09-19: 10 mg via INTRAMUSCULAR
  Filled 2014-09-19: qty 2

## 2014-09-19 MED ORDER — DIPHENHYDRAMINE HCL 50 MG/ML IJ SOLN
12.5000 mg | Freq: Once | INTRAMUSCULAR | Status: AC
  Administered 2014-09-19: 12.5 mg via INTRAMUSCULAR
  Filled 2014-09-19: qty 1

## 2014-09-19 NOTE — ED Notes (Signed)
Pt reports eye pain started 2 days ago . Today her eye is red and light hurts the eye.

## 2014-09-19 NOTE — Discharge Instructions (Signed)

## 2014-09-19 NOTE — ED Provider Notes (Signed)
CSN: [id number]     Arrival date & time 09/19/14  1346 History  This chart was scribed for non-physician practitioner, Glendell Docker, NP-C working with Charlesetta Shanks, MD by Einar Pheasant, ED scribe. This patient was seen in room TR04C/TR04C and the patient's care was started at 2:42 PM.     Chief Complaint  Patient presents with  . Eye Pain   The history is provided by the patient. No language interpreter was used.   HPI Comments: Brandi Durham is a 39 y.o. female with PMhx of Back pain presents to the Emergency Department complaining of onset persistent left eye pain that started 3 days ago. She is also complaining associated photophobia and rates her current pain in that eye as a "8/10". Positive redness, HA, and temporary blurry vision. pt states that her vision this morning was blurry but as the day progressed it resolved on its own. however, when she was asked to do a visual acuity test she noticed that her vision in that eye was a bit blurry. pt does not wear corrective lenses. Denies any hx of migraines, itching, eye drainage, nausea, emesis, fever, chills, or feeling of a foreign object.pt states that she feels pressure about the whole left eye. No injury or nasal drainage.  Past Medical History  Diagnosis Date  . Back problem    Past Surgical History  Procedure Laterality Date  . Back surgery    . Abdominal surgery    . Knee surgery    . Cesarean section    . Abdominal hysterectomy    . Partial hysterectomy    . Appendectomy     Family History  Problem Relation Age of Onset  . Diabetes Mother   . Hypertension Mother   . Heart failure Father    History  Substance Use Topics  . Smoking status: Current Some Day Smoker  . Smokeless tobacco: Not on file  . Alcohol Use: Yes   OB History    No data available     Review of Systems  Constitutional: Negative for fever and chills.  HENT: Negative for congestion.   Eyes: Positive for photophobia, pain, redness and  visual disturbance. Negative for discharge and itching.  Respiratory: Negative for cough and shortness of breath.   Gastrointestinal: Negative for nausea, vomiting and abdominal pain.  Neurological: Positive for headaches. Negative for weakness and numbness.  All other systems reviewed and are negative.     Allergies  Penicillins and Sulfur  Home Medications   Prior to Admission medications   Medication Sig Start Date End Date Taking? Authorizing Provider  HYDROcodone-acetaminophen (NORCO/VICODIN) 5-325 MG per tablet Take 1 tablet by mouth every 6 (six) hours as needed for pain (Take 1 - 2 tablets every 4 - 6 hours.). 11/09/12   Jarrett Soho Muthersbaugh, PA-C  methocarbamol (ROBAXIN) 750 MG tablet Take 1 tablet (750 mg total) by mouth 4 (four) times daily as needed (Take 1 tablet every 6 hours as needed for muscle spasms.). 11/09/12   Hannah Muthersbaugh, PA-C  naproxen (NAPROSYN) 500 MG tablet Take 1 tablet (500 mg total) by mouth 2 (two) times daily as needed. 11/09/12   Hannah Muthersbaugh, PA-C   BP 117/60 mmHg  Pulse 90  Temp(Src) 98.2 F (36.8 C) (Oral)  Resp 16  Ht 5\' 3"  (1.6 m)  SpO2 99%  LMP 09/11/2014  Physical Exam  Constitutional: She is oriented to person, place, and time. She appears well-developed and well-nourished. No distress.  HENT:  Head: Normocephalic and  atraumatic.  Right Ear: External ear normal.  Left Ear: External ear normal.  Eyes: EOM and lids are normal. Pupils are equal, round, and reactive to light. Left conjunctiva is injected.  Fundoscopic exam:      The right eye shows red reflex.       The left eye shows red reflex.  Neck: Neck supple. No tracheal deviation present.  Cardiovascular: Normal rate.   Pulmonary/Chest: Effort normal. No respiratory distress.  Musculoskeletal: Normal range of motion.  Neurological: She is alert and oriented to person, place, and time.  Skin: Skin is warm and dry.  Psychiatric: She has a normal mood and affect. Her  behavior is normal.  Nursing note and vitals reviewed.   ED Course  Procedures (including critical care time)  DIAGNOSTIC STUDIES: Oxygen Saturation is 99% on RA, normal by my interpretation.    COORDINATION OF CARE: 2:58 PM- Pt advised of plan for treatment and pt agrees.  Labs Review Labs Reviewed - No data to display  Imaging Review No results found.   EKG Interpretation None      MDM   Final diagnoses:  Headache, unspecified headache type    Pt symptoms have completely resolved with migraine cocktail. Think eye discomfort likely related to the headache  I personally performed the services described in this documentation, which was scribed in my presence. The recorded information has been reviewed and is accurate.    Glendell Docker, NP 09/19/14 Shawnee Alvino Chapel, MD 09/20/14 1454

## 2014-09-19 NOTE — ED Notes (Addendum)
Onset left eye pain 3 days ago. No known injury. Denies drainage. States has pain left periorbital area.

## 2015-05-11 ENCOUNTER — Other Ambulatory Visit (HOSPITAL_COMMUNITY)
Admission: RE | Admit: 2015-05-11 | Discharge: 2015-05-11 | Disposition: A | Discharge status: Home / Self Care | Payer: Managed Care, Other (non HMO) | Source: Ambulatory Visit | Attending: Family Medicine | Admitting: Family Medicine

## 2015-05-11 ENCOUNTER — Ambulatory Visit: Payer: Self-pay | Admitting: Family Medicine

## 2015-05-11 ENCOUNTER — Encounter: Payer: Self-pay | Admitting: Family Medicine

## 2015-05-11 ENCOUNTER — Ambulatory Visit (INDEPENDENT_AMBULATORY_CARE_PROVIDER_SITE_OTHER): Payer: Managed Care, Other (non HMO) | Admitting: Family Medicine

## 2015-05-11 VITALS — BP 128/80 | HR 64 | Ht 64.0 in | Wt 157.2 lb

## 2015-05-11 DIAGNOSIS — Z0000 Encounter for general adult medical examination without abnormal findings: Secondary | ICD-10-CM

## 2015-05-11 DIAGNOSIS — G8929 Other chronic pain: Secondary | ICD-10-CM

## 2015-05-11 DIAGNOSIS — D171 Benign lipomatous neoplasm of skin and subcutaneous tissue of trunk: Secondary | ICD-10-CM

## 2015-05-11 DIAGNOSIS — Z01419 Encounter for gynecological examination (general) (routine) without abnormal findings: Secondary | ICD-10-CM | POA: Insufficient documentation

## 2015-05-11 DIAGNOSIS — G44219 Episodic tension-type headache, not intractable: Secondary | ICD-10-CM | POA: Diagnosis not present

## 2015-05-11 DIAGNOSIS — N921 Excessive and frequent menstruation with irregular cycle: Secondary | ICD-10-CM | POA: Diagnosis not present

## 2015-05-11 DIAGNOSIS — Z1151 Encounter for screening for human papillomavirus (HPV): Secondary | ICD-10-CM | POA: Insufficient documentation

## 2015-05-11 DIAGNOSIS — D649 Anemia, unspecified: Secondary | ICD-10-CM | POA: Diagnosis not present

## 2015-05-11 DIAGNOSIS — M545 Low back pain, unspecified: Secondary | ICD-10-CM

## 2015-05-11 DIAGNOSIS — Z23 Encounter for immunization: Secondary | ICD-10-CM

## 2015-05-11 DIAGNOSIS — Z720 Tobacco use: Secondary | ICD-10-CM

## 2015-05-11 DIAGNOSIS — M549 Dorsalgia, unspecified: Secondary | ICD-10-CM

## 2015-05-11 DIAGNOSIS — F17200 Nicotine dependence, unspecified, uncomplicated: Secondary | ICD-10-CM

## 2015-05-11 LAB — CBC WITH DIFFERENTIAL/PLATELET
BASOS ABS: 0.1 10*3/uL (ref 0.0–0.1)
Basophils Relative: 1 % (ref 0–1)
EOS ABS: 0.1 10*3/uL (ref 0.0–0.7)
EOS PCT: 1 % (ref 0–5)
HCT: 35.1 % — ABNORMAL LOW (ref 36.0–46.0)
Hemoglobin: 11 g/dL — ABNORMAL LOW (ref 12.0–15.0)
Lymphocytes Relative: 26 % (ref 12–46)
Lymphs Abs: 2.4 10*3/uL (ref 0.7–4.0)
MCH: 24.1 pg — ABNORMAL LOW (ref 26.0–34.0)
MCHC: 31.3 g/dL (ref 30.0–36.0)
MCV: 77 fL — ABNORMAL LOW (ref 78.0–100.0)
MPV: 8.7 fL (ref 8.6–12.4)
Monocytes Absolute: 0.7 10*3/uL (ref 0.1–1.0)
Monocytes Relative: 8 % (ref 3–12)
NEUTROS PCT: 64 % (ref 43–77)
Neutro Abs: 5.9 10*3/uL (ref 1.7–7.7)
PLATELETS: 523 10*3/uL — AB (ref 150–400)
RBC: 4.56 MIL/uL (ref 3.87–5.11)
RDW: 16.5 % — ABNORMAL HIGH (ref 11.5–15.5)
WBC: 9.2 10*3/uL (ref 4.0–10.5)

## 2015-05-11 LAB — COMPREHENSIVE METABOLIC PANEL
ALBUMIN: 3.8 g/dL (ref 3.6–5.1)
ALK PHOS: 64 U/L (ref 33–115)
ALT: 8 U/L (ref 6–29)
AST: 10 U/L (ref 10–30)
BILIRUBIN TOTAL: 0.6 mg/dL (ref 0.2–1.2)
BUN: 10 mg/dL (ref 7–25)
CALCIUM: 8.9 mg/dL (ref 8.6–10.2)
CO2: 26 mmol/L (ref 20–31)
Chloride: 100 mmol/L (ref 98–110)
Creat: 0.55 mg/dL (ref 0.50–1.10)
Glucose, Bld: 88 mg/dL (ref 65–99)
Potassium: 4.7 mmol/L (ref 3.5–5.3)
Sodium: 139 mmol/L (ref 135–146)
TOTAL PROTEIN: 7.4 g/dL (ref 6.1–8.1)

## 2015-05-11 LAB — POCT URINALYSIS DIPSTICK
BILIRUBIN UA: NEGATIVE
Glucose, UA: NEGATIVE
Ketones, UA: NEGATIVE
Leukocytes, UA: NEGATIVE
Nitrite, UA: NEGATIVE
Protein, UA: NEGATIVE
Spec Grav, UA: 1.02
Urobilinogen, UA: NEGATIVE
pH, UA: 6

## 2015-05-11 LAB — LIPID PANEL
CHOL/HDL RATIO: 4.5 ratio (ref <=5.0)
CHOLESTEROL: 184 mg/dL (ref 125–200)
HDL: 41 mg/dL — AB (ref >=46)
LDL Cholesterol: 120 mg/dL (ref <130)
Triglycerides: 117 mg/dL (ref <150)
VLDL: 23 mg/dL (ref <30)

## 2015-05-11 NOTE — Progress Notes (Signed)
Subjective:    Patient ID: Brandi Durham, female    DOB: 08-02-1976, 39 y.o.   MRN: [id number]  HPI She is new to the practice and is here for a complete physical exam. She has not seen a provider in several years. She also has complaints of 3 day history of right low back and side pain, non-radiating and worse with movement. Denies fever, chills, weight loss, fatigue, numbness, tingling, weakness to lower extremities.She denies injury, reports a history of chronic back pain. Denies dysuria, urinary frequency, dark or maldorous urine, or blood in urine. States she has been getting narcotic pain medication from Cooper Render, MD for back pain but states she has not been there in a while. Reports history of back surgeries. Also complains of mass on her back that she was told is a lipoma. She states she does not like how it looks. She also complains of headaches that occur 3-4 days per week in the late afternoon. She states the headaches are located to bilateral temporal areas and and that her neck gets tight. Denies associated nausea, vomiting, photophobia, phonophobia.  Would like a pap smear today. Also denies chest pain, palpitations, DOE, change in bowel habits, nausea, vomiting, diarrhea.  Also complains of headaches in the evening 4- 6 pm in evening for past several months Works at [employer] all day. Allergy medication.  Other providers she sees: Rhae Hammock, ortho on [address].    Social: smokes- only when she drinks  Drinks-socially  Drug use- denies  Married and happy.   Children 6 year old twins, girls, and a 60 year old grandchild.  Immunizations: Flu- would like, [place] maintenance: dentist-has appointment, pap smear - has not had one in years. Colonoscopy never, mammogram never. Eyes - vision good.   Reviewed medications, allergies, past medical, surgical, family history.  She is also requesting a work note for her daughter today for bringing her to this  appointment.  Review of Systems    Review of Systems Constitutional: -fever, -chills, -sweats, -unexpected weight change,-fatigue ENT: -runny nose, -ear pain, -sore throat Cardiology:  -chest pain, -palpitations, -edema Respiratory: -cough, -shortness of breath, -wheezing Gastroenterology: -abdominal pain, -nausea, -vomiting, -diarrhea, -constipation Hematology: -bleeding or bruising problems Musculoskeletal: -arthralgias, -myalgias, -joint swelling, +back pain Ophthalmology: -vision changes Urology: -dysuria, -difficulty urinating, -hematuria, -urinary frequency, -urgency Neurology: -headache, -weakness, -tingling, -numbness    Objective:   Physical Exam  BP 128/80 mmHg  Pulse 64  Ht 5\' 4"  (1.626 m)  Wt 157 lb 3.2 oz (71.305 kg)  BMI 26.97 kg/m2  LMP 05/08/2015  General Appearance:    Alert, cooperative, no distress, appears stated age  Head:    Normocephalic, without obvious abnormality, atraumatic  Eyes:    PERRL, conjunctiva/corneas clear, EOM's intact, fundi    benign  Ears:    Normal TM's and external ear canals, fair amount of cerumen to bilateral canals  Nose:   Nares normal, mucosa normal, no drainage or sinus   tenderness  Throat:   Lips, mucosa, and tongue normal; teeth and gums normal  Neck:   Supple, no lymphadenopathy;  thyroid:  no   enlargement/tenderness/nodules  Back:    Spine nontender, no curvature, ROM normal, Right low back and side tenderness to palpation, no rash or bruising, approximately 10 cm round, soft lump to right of spine mid back area.  Lungs:     Clear to auscultation bilaterally without wheezes, rales or     ronchi; respirations unlabored  Chest  Wall:    No tenderness or deformity   Heart:    Regular rate and rhythm, S1 and S2 normal, no murmur, rub   or gallop  Breast Exam:    No tenderness, masses, or nipple discharge or inversion.      No axillary lymphadenopathy  Abdomen:     Soft, non-tender, nondistended, normoactive bowel sounds,     no masses, no hepatosplenomegaly  Genitalia:    Normal external genitalia without lesions.  vagina normal; cervix without lesions, or cervical motion tenderness. No abnormal vaginal discharge.  Uterus and adnexa not enlarged, nontender, no masses.  Pap performed  Rectal:    Deferred  Extremities:   No clubbing, cyanosis or edema  Pulses:   2+ and symmetric all extremities  Skin:   Skin color, texture, turgor normal, no rashes or lesions  Lymph nodes:   Cervical, supraclavicular, and axillary nodes normal  Neurologic:   CNII-XII intact, normal strength, sensation and gait; reflexes 2+ to bilateral biceps, and brachioradialis, 2+ patellar to left, 1+ to patellar right, 1+ to bilateral achilles           Psych:   Normal mood, affect, hygiene and grooming.    Urinalysis dipstick trace of blood.      Assessment & Plan:  Routine general medical examination at a health care facility - Plan: Visual acuity screening, Urinalysis Dipstick, Comprehensive metabolic panel, Cytology - PAP, CBC with Differential/Platelet, Lipid panel, CANCELED: Lipid Panel, CANCELED: CBC with Differential  Menorrhagia with irregular cycle  Right-sided low back pain without sciatica - Plan: Urine culture  Need for Tdap vaccination  Need for prophylactic vaccination and inoculation against influenza - Plan: Flu Vaccine QUAD 36+ mos IM  Smoker  Lipoma of back  Chronic back pain  Episodic tension-type headache, not intractable  Discussed that she has been having heavy periods and cramping for several years. Recommended trying ibuprofen 600 mg up to 3 times daily beginning with the first day of her cycle. Also discussed that she is not a good candidate for hormone therapy since she is a smoker and >71 years old.  She is not ready to stop smoking at this time. Also discussed that her right low back and side pain does not appear to be kidney related since her pain is worse with movement and she is not having any urinary  symptoms. We will culture her urine and bring her back for a repeat UA. Discussed that the lipoma on her back is nothing to worry about but this seems to be bothering her on a cosmetic level. Advised against surgery for cosmetic reason. Discussed that she should continue seeing her orthopedist who initially prescribed narcotic pain medication and that I will not be managing her chronic back pain.  Will follow up pending lab results. She will receive flu shot and Tdap today. Note provided for her daughter.

## 2015-05-11 NOTE — Patient Instructions (Addendum)
Take 600 mg of ibuprofen 3 times per day on the first day of your menstrual cycle and up to 3 days after if you are having cramping along with your heavy bleeding. We will check your labs today and notify you when the results are back. Follow up with your orthopedist for your back pain.  For your tension headaches, stay hydrated, do not skip meals, and try heat on back of your neck for 20 minutes then light stretching.  Your urine today had a trace of blood in it but you are not having any urinary symptoms today. Your back pain is most likely musculoskeletal but I would like to bring you back in 2 weeks for a repeat urine. Try heat 20 mins 2-3 times per day and Ibuprofen 600mg  3 times daily for next 3-4 days and see if this helps. Let us know if your pain worsens or if  Preventative Care for Adults - Female      MAINTAIN REGULAR HEALTH EXAMS:  A routine yearly physical is a good way to check in with your primary care provider about your health and preventive screening. It is also an opportunity to share updates about your health and any concerns you have, and receive a thorough all-over exam.   Most health insurance companies pay for at least some preventative services.  Check with your health plan for specific coverages.  WHAT PREVENTATIVE SERVICES DO WOMEN NEED?  Adult women should have their weight and blood pressure checked regularly.   Women age 49 and older should have their cholesterol levels checked regularly.  Women should be screened for cervical cancer with a Pap smear and pelvic exam beginning at either age 36, or 3 years after they become sexually activity.    Breast cancer screening generally begins at age 38 with a mammogram and breast exam by your primary care provider.    Beginning at age 13 and continuing to age 45, women should be screened for colorectal cancer.  Certain people may need continued testing until age 68.  Updating vaccinations is part of preventative care.   Vaccinations help protect against diseases such as the flu.  Osteoporosis is a disease in which the bones lose minerals and strength as we age. Women ages 42 and over should discuss this with their caregivers, as should women after menopause who have other risk factors.  Lab tests are generally done as part of preventative care to screen for anemia and blood disorders, to screen for problems with the kidneys and liver, to screen for bladder problems, to check blood sugar, and to check your cholesterol level.  Preventative services generally include counseling about diet, exercise, avoiding tobacco, drugs, excessive alcohol consumption, and sexually transmitted infections.    GENERAL RECOMMENDATIONS FOR GOOD HEALTH:  Healthy diet:  Eat a variety of foods, including fruit, vegetables, animal or vegetable protein, such as meat, fish, chicken, and eggs, or beans, lentils, tofu, and grains, such as rice.  Drink plenty of water daily.  Decrease saturated fat in the diet, avoid lots of red meat, processed foods, sweets, fast foods, and fried foods.  Exercise:  Aerobic exercise helps maintain good heart health. At least 30-40 minutes of moderate-intensity exercise is recommended. For example, a brisk walk that increases your heart rate and breathing. This should be done on most days of the week.   Find a type of exercise or a variety of exercises that you enjoy so that it becomes a part of your daily life.  Examples are running, walking, swimming, water aerobics, and biking.  For motivation and support, explore group exercise such as aerobic class, spin class, Zumba, Yoga,or  martial arts, etc.    Set exercise goals for yourself, such as a certain weight goal, walk or run in a race such as a 5k walk/run.  Speak to your primary care provider about exercise goals.  Disease prevention:  If you smoke or chew tobacco, find out from your caregiver how to quit. It can literally save your life, no matter  how long you have been a tobacco user. If you do not use tobacco, never begin.   Maintain a healthy diet and normal weight. Increased weight leads to problems with blood pressure and diabetes.   The Body Mass Index or BMI is a way of measuring how much of your body is fat. Having a BMI above 27 increases the risk of heart disease, diabetes, hypertension, stroke and other problems related to obesity. Your caregiver can help determine your BMI and based on it develop an exercise and dietary program to help you achieve or maintain this important measurement at a healthful level.  High blood pressure causes heart and blood vessel problems.  Persistent high blood pressure should be treated with medicine if weight loss and exercise do not work.   Fat and cholesterol leaves deposits in your arteries that can block them. This causes heart disease and vessel disease elsewhere in your body.  If your cholesterol is found to be high, or if you have heart disease or certain other medical conditions, then you may need to have your cholesterol monitored frequently and be treated with medication.   Ask if you should have a cardiac stress test if your history suggests this. A stress test is a test done on a treadmill that looks for heart disease. This test can find disease prior to there being a problem.  Menopause can be associated with physical symptoms and risks. Hormone replacement therapy is available to decrease these. You should talk to your caregiver about whether starting or continuing to take hormones is right for you.   Osteoporosis is a disease in which the bones lose minerals and strength as we age. This can result in serious bone fractures. Risk of osteoporosis can be identified using a bone density scan. Women ages 46 and over should discuss this with their caregivers, as should women after menopause who have other risk factors. Ask your caregiver whether you should be taking a calcium supplement and  Vitamin D, to reduce the rate of osteoporosis.   Avoid drinking alcohol in excess (more than two drinks per day).  Avoid use of street drugs. Do not share needles with anyone. Ask for professional help if you need assistance or instructions on stopping the use of alcohol, cigarettes, and/or drugs.  Brush your teeth twice a day with fluoride toothpaste, and floss once a day. Good oral hygiene prevents tooth decay and gum disease. The problems can be painful, unattractive, and can cause other health problems. Visit your dentist for a routine oral and dental check up and preventive care every 6-12 months.   Look at your skin regularly.  Use a mirror to look at your back. Notify your caregivers of changes in moles, especially if there are changes in shapes, colors, a size larger than a pencil eraser, an irregular border, or development of new moles.  Safety:  Use seatbelts 100% of the time, whether driving or as a passenger.  Use safety  devices such as hearing protection if you work in environments with loud noise or significant background noise.  Use safety glasses when doing any work that could send debris in to the eyes.  Use a helmet if you ride a bike or motorcycle.  Use appropriate safety gear for contact sports.  Talk to your caregiver about gun safety.  Use sunscreen with a SPF (or skin protection factor) of 15 or greater.  Lighter skinned people are at a greater risk of skin cancer. Don't forget to also wear sunglasses in order to protect your eyes from too much damaging sunlight. Damaging sunlight can accelerate cataract formation.   Practice safe sex. Use condoms. Condoms are used for birth control and to help reduce the spread of sexually transmitted infections (or STIs).  Some of the STIs are gonorrhea (the clap), chlamydia, syphilis, trichomonas, herpes, HPV (human papilloma virus) and HIV (human immunodeficiency virus) which causes AIDS. The herpes, HIV and HPV are viral illnesses that have  no cure. These can result in disability, cancer and death.   Keep carbon monoxide and smoke detectors in your home functioning at all times. Change the batteries every 6 months or use a model that plugs into the wall.   Vaccinations:  Stay up to date with your tetanus shots and other required immunizations. You should have a booster for tetanus every 10 years. Be sure to get your flu shot every year, since 5%-20% of the [place] population comes down with the flu. The flu vaccine changes each year, so being vaccinated once is not enough. Get your shot in the fall, before the flu season peaks.   Other vaccines to consider:  Human Papilloma Virus or HPV causes cancer of the cervix, and other infections that can be transmitted from person to person. There is a vaccine for HPV, and females should get immunized between the ages of 67 and 1. It requires a series of 3 shots.   Pneumococcal vaccine to protect against certain types of pneumonia.  This is normally recommended for adults age 74 or older.  However, adults younger than 41 years old with certain underlying conditions such as diabetes, heart or lung disease should also receive the vaccine.  Shingles vaccine to protect against Varicella Zoster if you are older than age 61, or younger than 41 years old with certain underlying illness.  Hepatitis A vaccine to protect against a form of infection of the liver by a virus acquired from food.  Hepatitis B vaccine to protect against a form of infection of the liver by a virus acquired from blood or body fluids, particularly if you work in health care.  If you plan to travel internationally, check with your local health department for specific vaccination recommendations.  Cancer Screening:  Breast cancer screening is essential to preventive care for women. All women age 59 and older should perform a breast self-exam every month. At age 74 and older, women should have their caregiver complete a breast  exam each year. Women at ages 45 and older should have a mammogram (x-ray film) of the breasts. Your caregiver can discuss how often you need mammograms.    Cervical cancer screening includes taking a Pap smear (sample of cells examined under a microscope) from the cervix (end of the uterus). It also includes testing for HPV (Human Papilloma Virus, which can cause cervical cancer). Screening and a pelvic exam should begin at age 74, or 3 years after a woman becomes sexually active. Screening should occur  every year, with a Pap smear but no HPV testing, up to age 40. After age 50, you should have a Pap smear every 3 years with HPV testing, if no HPV was found previously.   Most routine colon cancer screening begins at the age of 40. On a yearly basis, doctors may provide special easy to use take-home tests to check for hidden blood in the stool. Sigmoidoscopy or colonoscopy can detect the earliest forms of colon cancer and is life saving. These tests use a small camera at the end of a tube to directly examine the colon. Speak to your caregiver about this at age 80, when routine screening begins (and is repeated every 5 years unless early forms of pre-cancerous polyps or small growths are found).

## 2015-05-12 LAB — CYTOLOGY - PAP

## 2015-05-12 NOTE — Addendum Note (Signed)
Addended by: Minette Headland A on: 05/12/2015 12:21 PM   Modules accepted: Orders

## 2015-05-14 LAB — URINE CULTURE: Colony Count: 25000

## 2015-06-22 ENCOUNTER — Ambulatory Visit: Payer: Self-pay | Admitting: Family Medicine

## 2016-02-03 ENCOUNTER — Ambulatory Visit (INDEPENDENT_AMBULATORY_CARE_PROVIDER_SITE_OTHER): Payer: Managed Care, Other (non HMO) | Admitting: Family Medicine

## 2016-02-03 ENCOUNTER — Encounter: Payer: Self-pay | Admitting: Family Medicine

## 2016-02-03 VITALS — BP 120/70 | HR 78 | Temp 98.2°F | Wt 158.0 lb

## 2016-02-03 DIAGNOSIS — J069 Acute upper respiratory infection, unspecified: Secondary | ICD-10-CM | POA: Diagnosis not present

## 2016-02-03 MED ORDER — BENZONATATE 100 MG PO CAPS: 200.0000 mg | ORAL_CAPSULE | Freq: Three times a day (TID) | ORAL | Status: DC | PRN

## 2016-02-03 NOTE — Patient Instructions (Signed)
Advil or Aleve for the aches and pains gargle with whatever works.   Robitussin-DM during the day and NyQuil at night

## 2016-02-03 NOTE — Progress Notes (Signed)
   Subjective:    Patient ID: Brandi Durham, female    DOB: 03/12/1976, 40 y.o.   MRN: [id number]  HPI  she complains of a four-day history started with sneezing, rhinorrhea, nasal congestion, pressure with headache, PND. Yesterday she developed a cough with slight chills sore throat and fatigue. She does smoke. Cough is nonproductive.   Review of Systems     Objective:   Physical Exam Alert and in no distress. Tympanic membranes and canals are normal. Pharyngeal area is normal. Neck is supple without adenopathy or thyromegaly. Cardiac exam shows a regular sinus rhythm without murmurs or gallops. Lungs are clear to auscultation.        Assessment & Plan:  Acute URI - Plan: benzonatate (TESSALON) 100 MG capsule  I explained that her symptoms are consistent with a viral URI. Recommended supportive care with Advil/Aleve/ Robitussin-DM during the day and NyQuil at night and also Tessalon Perles if needed. Explained that by Friday she should be feeling better but if she is getting worse call for possible antibiotic.

## 2016-03-22 ENCOUNTER — Encounter: Payer: Self-pay | Admitting: Family Medicine

## 2016-03-22 ENCOUNTER — Ambulatory Visit (INDEPENDENT_AMBULATORY_CARE_PROVIDER_SITE_OTHER): Payer: Managed Care, Other (non HMO) | Admitting: Family Medicine

## 2016-03-22 VITALS — BP 120/70 | HR 68 | Temp 97.9°F | Wt 155.6 lb

## 2016-03-22 DIAGNOSIS — M545 Low back pain, unspecified: Secondary | ICD-10-CM

## 2016-03-22 DIAGNOSIS — G8929 Other chronic pain: Secondary | ICD-10-CM | POA: Diagnosis not present

## 2016-03-22 DIAGNOSIS — M542 Cervicalgia: Secondary | ICD-10-CM

## 2016-03-22 MED ORDER — NAPROXEN 500 MG PO TABS: 500.0000 mg | ORAL_TABLET | Freq: Two times a day (BID) | ORAL | Status: DC

## 2016-03-22 NOTE — Patient Instructions (Addendum)
I recommend that you take anti-inflammatories, use heat and stretches as tolerated. I'm referring you to physical therapy for chronic neck and back pain.  I recommend you follow up with Dr. Cyndy Freeze for further evaluation and treatment of chronic pain.  Back Exercises The following exercises strengthen the muscles that help to support the back. They also help to keep the lower back flexible. Doing these exercises can help to prevent back pain or lessen existing pain. If you have back pain or discomfort, try doing these exercises 2-3 times each day or as told by your health care provider. When the pain goes away, do them once each day, but increase the number of times that you repeat the steps for each exercise (do more repetitions). If you do not have back pain or discomfort, do these exercises once each day or as told by your health care provider. EXERCISES Single Knee to Chest Repeat these steps 3-5 times for each leg: 1. Lie on your back on a firm bed or the floor with your legs extended. 2. Bring one knee to your chest. Your other leg should stay extended and in contact with the floor. 3. Hold your knee in place by grabbing your knee or thigh. 4. Pull on your knee until you feel a gentle stretch in your lower back. 5. Hold the stretch for 10-30 seconds. 6. Slowly release and straighten your leg. Pelvic Tilt Repeat these steps 5-10 times: 1. Lie on your back on a firm bed or the floor with your legs extended. 2. Bend your knees so they are pointing toward the ceiling and your feet are flat on the floor. 3. Tighten your lower abdominal muscles to press your lower back against the floor. This motion will tilt your pelvis so your tailbone points up toward the ceiling instead of pointing to your feet or the floor. 4. With gentle tension and even breathing, hold this position for 5-10 seconds. Cat-Cow Repeat these steps until your lower back becomes more flexible: 1. Get into a hands-and-knees  position on a firm surface. Keep your hands under your shoulders, and keep your knees under your hips. You may place padding under your knees for comfort. 2. Let your head hang down, and point your tailbone toward the floor so your lower back becomes rounded like the back of a cat. 3. Hold this position for 5 seconds. 4. Slowly lift your head and point your tailbone up toward the ceiling so your back forms a sagging arch like the back of a cow. 5. Hold this position for 5 seconds. Press-Ups Repeat these steps 5-10 times: 1. Lie on your abdomen (face-down) on the floor. 2. Place your palms near your head, about shoulder-width apart. 3. While you keep your back as relaxed as possible and keep your hips on the floor, slowly straighten your arms to raise the top half of your body and lift your shoulders. Do not use your back muscles to raise your upper torso. You may adjust the placement of your hands to make yourself more comfortable. 4. Hold this position for 5 seconds while you keep your back relaxed. 5. Slowly return to lying flat on the floor. Bridges Repeat these steps 10 times: 1. Lie on your back on a firm surface. 2. Bend your knees so they are pointing toward the ceiling and your feet are flat on the floor. 3. Tighten your buttocks muscles and lift your buttocks off of the floor until your waist is at almost the same  height as your knees. You should feel the muscles working in your buttocks and the back of your thighs. If you do not feel these muscles, slide your feet 1-2 inches farther away from your buttocks. 4. Hold this position for 3-5 seconds. 5. Slowly lower your hips to the starting position, and allow your buttocks muscles to relax completely. If this exercise is too easy, try doing it with your arms crossed over your chest. Abdominal Crunches Repeat these steps 5-10 times: 1. Lie on your back on a firm bed or the floor with your legs extended. 2. Bend your knees so they are  pointing toward the ceiling and your feet are flat on the floor. 3. Cross your arms over your chest. 4. Tip your chin slightly toward your chest without bending your neck. 5. Tighten your abdominal muscles and slowly raise your trunk (torso) high enough to lift your shoulder blades a tiny bit off of the floor. Avoid raising your torso higher than that, because it can put too much stress on your low back and it does not help to strengthen your abdominal muscles. 6. Slowly return to your starting position. Back Lifts Repeat these steps 5-10 times: 1. Lie on your abdomen (face-down) with your arms at your sides, and rest your forehead on the floor. 2. Tighten the muscles in your legs and your buttocks. 3. Slowly lift your chest off of the floor while you keep your hips pressed to the floor. Keep the back of your head in line with the curve in your back. Your eyes should be looking at the floor. 4. Hold this position for 3-5 seconds. 5. Slowly return to your starting position. SEEK MEDICAL CARE IF:  Your back pain or discomfort gets much worse when you do an exercise.  Your back pain or discomfort does not lessen within 2 hours after you exercise. If you have any of these problems, stop doing these exercises right away. Do not do them again unless your health care provider says that you can. SEEK IMMEDIATE MEDICAL CARE IF:  You develop sudden, severe back pain. If this happens, stop doing the exercises right away. Do not do them again unless your health care provider says that you can.   This information is not intended to replace advice given to you by your health care provider. Make sure you discuss any questions you have with your health care provider.   Document Released: 10/06/2004 Document Revised: 05/20/2015 Document Reviewed: 10/23/2014 Elsevier Interactive Patient Education [employer].

## 2016-03-22 NOTE — Progress Notes (Signed)
Subjective:    Patient ID: Brandi Durham, female    DOB: 1976-05-15, 40 y.o.   MRN: [id number]  HPI Chief Complaint  Patient presents with  . pain from neck down to legs    pain from neck down to legs. went to ortho and had MRI maybe a year ago and everything was fine. has a lump in back.    She is here with complaints of chronic back with an acute flare of worsening pain to right side of her neck, back, and right leg pain. States she is having some numbness and tingling down her right arm that is intermittent and lasts about 20 minutes at a time.  Reports she took 2 Aleve twice daily for about a month but has not been taking any medication in past few weeks.  She states she followed up with Dr. Cyndy Freeze and went to GI for a repeat MRI in September 2016 after seeing me the last time.  Denies fever, chills, weight loss, tingling, weakness to lower extremities.She denies injury, reports a history of chronic back pain. Denies dysuria, urinary frequency, dark or maldorous urine, or blood in urine.  States she has been getting narcotic pain medication from [facility], Dr. Cyndy Freeze, for back pain but states she has not been there in a while. Reports history of back surgeries. Also complains of mass on her back that she was told is a lipoma.  Denies chest pain, palpitations, DOE, change in bowel habits, nausea, vomiting, diarrhea. Denies loss of control of bowel or bladder.  Works at [employer] all day.  Other providers she sees: Ashok Pall, ortho on [address].   Social: smokes- only when she drinks Drinks-socially Drug use- denies Married   Children 26 year old twins, girls, and a 42 year old grandchild.    Reviewed medications, allergies, past medical, surgical, and social history  She is also requesting a work note for the next few days until she can see her orthopedist. She is also requesting a note for her job to be able to have a closer  parking space.  The following MRI results (from 2015) nothing showing up in system since then.   IMPRESSION: 1. No acute osseous or paraspinal findings are demonstrated within the cervical or thoracic spine. 2. Mild cervical spine disc bulging without focal disc herniation, high-grade spinal stenosis or nerve root encroachment. 3. No significant thoracic disc space pathology. 4. 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  IMPRESSION: 1. Postsurgical changes on the left at L5-S1 without evidence for residual or recurrent disc protrusion or stenosis. 2. Minimal disc bulging at L4-5 without significant stenosis.  Past Medical History  Diagnosis Date  . Back problem      Review of Systems Pertinent positives and negatives in the history of present illness.     Objective:   Physical Exam  Constitutional: She is oriented to person, place, and time. She appears well-developed and well-nourished. No distress.  Neck: Neck supple. Muscular tenderness present. No spinous process tenderness present. No rigidity. Decreased range of motion present. No erythema present.  Patient with severe tenderness to light palpation to diffuse neck and back, no bony tenderness. No erythema, bruising, rash. Unable to do a full exam due to patient unable to perform ROM during exam but movement noted to be more fluid upon patient ambulating in hallway and to check out.   Cardiovascular: Normal rate, regular rhythm, normal heart sounds and intact distal pulses.   Pulmonary/Chest: Effort normal and breath sounds normal.  Musculoskeletal:       Cervical back: She exhibits decreased range of motion and tenderness. She exhibits no bony tenderness, no edema and no spasm.       Thoracic back: She exhibits decreased range of motion, tenderness and pain. She exhibits no bony tenderness and no spasm.       Lumbar back: She exhibits decreased range of motion, tenderness and pain. She exhibits no bony tenderness, no swelling, no edema  and no spasm.       Back:  Lipoma unchanged since last visit.   Lymphadenopathy:    She has no cervical adenopathy.  Neurological: She is alert and oriented to person, place, and time. She has normal strength and normal reflexes. No cranial nerve deficit or sensory deficit. Coordination and gait normal.  Skin: Skin is warm and dry. No rash noted.   BP 120/70 mmHg  Pulse 68  Temp(Src) 97.9 F (36.6 C) (Oral)  Wt 155 lb 9.6 oz (70.58 kg)      Assessment & Plan:  Chronic neck pain - Plan: Ambulatory referral to Physical Therapy  Chronic lumbar pain - Plan: Ambulatory referral to Physical Therapy  Discussed patient with Dr Redmond School and he agrees with plan of care. Discussed with patient that Gabriel Cirri, CMA, called Dr. Hewitt Shorts office and [facility] and neither place has a record of her going there in September 2016 as she said. She is adamant that she did go to Dr Cyndy Freeze. States her MRI of back was "normal" in 05/2015 but I cannot see this in the computer.  Discussed that I will prescribe pain medication but not a controlled substance for her. She is aware that she will need to follow up with Dr. Cyndy Freeze since she has been under his care for chronic back pain. Offered referral to PT, she would like for me to refer her for this. Referral made. Naproxen prescription sent.  She will follow up with Dr. Cyndy Freeze for pain. Discussed that I will be willing to manage her primary care.

## 2016-05-17 ENCOUNTER — Telehealth: Payer: Self-pay | Admitting: Internal Medicine

## 2016-05-17 DIAGNOSIS — R319 Hematuria, unspecified: Secondary | ICD-10-CM

## 2016-05-17 NOTE — Telephone Encounter (Signed)
-----   Message from Girtha Rm, NP sent at 05/17/2016  7:48 AM EDT ----- She was supposed to return for a repeat CBC due to mild anemia. Please check and see if she plans to do this.   ----- Message ----- From: SYSTEM Sent: 05/16/2016  12:05 AM To: Girtha Rm, NP

## 2016-05-17 NOTE — Telephone Encounter (Signed)
Pt will call back after she looks at her work schedule to schedule for blood work and urine

## 2016-05-17 NOTE — Progress Notes (Signed)
Pt is going to call back after she looks at her schedule and do nurse visit for CBC and URINE

## 2016-09-20 ENCOUNTER — Encounter: Payer: Self-pay | Admitting: Family Medicine

## 2016-09-20 ENCOUNTER — Ambulatory Visit (INDEPENDENT_AMBULATORY_CARE_PROVIDER_SITE_OTHER): Payer: Managed Care, Other (non HMO) | Admitting: Family Medicine

## 2016-09-20 VITALS — BP 110/70 | HR 83 | Temp 98.4°F | Wt 156.2 lb

## 2016-09-20 DIAGNOSIS — H5711 Ocular pain, right eye: Secondary | ICD-10-CM | POA: Diagnosis not present

## 2016-09-20 DIAGNOSIS — H5461 Unqualified visual loss, right eye, normal vision left eye: Secondary | ICD-10-CM

## 2016-09-20 DIAGNOSIS — H547 Unspecified visual loss: Secondary | ICD-10-CM

## 2016-09-20 NOTE — Progress Notes (Signed)
   Subjective:    Patient ID: Brandi Durham, female    DOB: 1976/03/06, 41 y.o.   MRN: [id number]  HPI Chief Complaint  Patient presents with  . swollen eye    hurt last night and this morning was swollen and hurting. not itchy,   She is here with complaints of a 24 hour history of right eye redness, moderate-severe pain and blurred vision that started yesterday. No known injury.  States she used a hot compress and woke up with her right eye red, swollen.   No foreign body sensation. No drainage or itching.   Does not wear contact lenses or glasses.   Past Medical History:  Diagnosis Date  . Back problem     Past Surgical History:  Procedure Laterality Date  . ABDOMINAL HYSTERECTOMY    . ABDOMINAL SURGERY    . APPENDECTOMY    . BACK SURGERY    . CESAREAN SECTION    . KNEE SURGERY    . PARTIAL HYSTERECTOMY     Social History   Social History  . Marital status: Single    Spouse name: N/A  . Number of children: N/A  . Years of education: N/A   Occupational History  . Not on file.   Social History Main Topics  . Smoking status: Current Some Day Smoker  . Smokeless tobacco: Not on file  . Alcohol use Yes  . Drug use: No  . Sexual activity: Yes    Birth control/ protection: None   Other Topics Concern  . Not on file   Social History Narrative  . No narrative on file      Reviewed allergies, medications, past medical, and social history.    Review of Systems Pertinent positives and negatives in the history of present illness.     Objective:   Physical Exam  Constitutional: She appears well-developed and well-nourished. No distress.  HENT:  Right Ear: Tympanic membrane and ear canal normal.  Left Ear: Tympanic membrane and ear canal normal.  Nose: Nose normal. Right sinus exhibits no maxillary sinus tenderness and no frontal sinus tenderness. Left sinus exhibits no maxillary sinus tenderness and no frontal sinus tenderness.  Mouth/Throat: Uvula is  midline, oropharynx is clear and moist and mucous membranes are normal.  Eyes: EOM are normal. Pupils are equal, round, and reactive to light. Right conjunctiva is injected.  Severe tenderness with palpation to upper and lower lids. Right eye pain with lateral EOMs.  Benign fundus. Did not dilate.    BP 110/70   Pulse 83   Temp 98.4 F (36.9 C) (Oral)   Wt 156 lb 3.2 oz (70.9 kg)   SpO2 97%   BMI 26.81 kg/m       Assessment & Plan:  Acute right eye pain  Decreased vision of right eye  Discussed that she has symptoms that warrant immediate evaluation by an opthomologist such as photophobia, vision loss, and eye pain.  An aappointment was made for her for this afternoon at Dr. Zenia Resides office.

## 2016-09-20 NOTE — Patient Instructions (Signed)
You have an appointment today at 3:15 pm at Dr. Zenia Resides office for your eye issue. [address] in [place]. Phone # [phone]

## 2016-12-19 ENCOUNTER — Ambulatory Visit (INDEPENDENT_AMBULATORY_CARE_PROVIDER_SITE_OTHER): Payer: Managed Care, Other (non HMO) | Admitting: Family Medicine

## 2016-12-19 ENCOUNTER — Ambulatory Visit
Admission: RE | Admit: 2016-12-19 | Discharge: 2016-12-19 | Disposition: A | Discharge status: Home / Self Care | Payer: Managed Care, Other (non HMO) | Source: Ambulatory Visit | Attending: Family Medicine | Admitting: Family Medicine

## 2016-12-19 ENCOUNTER — Encounter: Payer: Self-pay | Admitting: Family Medicine

## 2016-12-19 VITALS — BP 110/70 | HR 70 | Temp 98.4°F | Wt 159.0 lb

## 2016-12-19 DIAGNOSIS — G8929 Other chronic pain: Secondary | ICD-10-CM

## 2016-12-19 DIAGNOSIS — M545 Low back pain, unspecified: Secondary | ICD-10-CM

## 2016-12-19 DIAGNOSIS — H209 Unspecified iridocyclitis: Secondary | ICD-10-CM

## 2016-12-19 DIAGNOSIS — D171 Benign lipomatous neoplasm of skin and subcutaneous tissue of trunk: Secondary | ICD-10-CM

## 2016-12-19 LAB — CBC WITH DIFFERENTIAL/PLATELET
BASOS PCT: 0 %
Basophils Absolute: 0 cells/uL (ref 0–200)
EOS ABS: 108 {cells}/uL (ref 15–500)
Eosinophils Relative: 1 %
HCT: 36.2 % (ref 35.0–45.0)
Hemoglobin: 11.4 g/dL — ABNORMAL LOW (ref 11.7–15.5)
Lymphocytes Relative: 25 %
Lymphs Abs: 2700 cells/uL (ref 850–3900)
MCH: 24.1 pg — ABNORMAL LOW (ref 27.0–33.0)
MCHC: 31.5 g/dL — ABNORMAL LOW (ref 32.0–36.0)
MCV: 76.4 fL — ABNORMAL LOW (ref 80.0–100.0)
MONO ABS: 756 {cells}/uL (ref 200–950)
MPV: 9 fL (ref 7.5–12.5)
Monocytes Relative: 7 %
Neutro Abs: 7236 cells/uL (ref 1500–7800)
Neutrophils Relative %: 67 %
Platelets: 509 10*3/uL — ABNORMAL HIGH (ref 140–400)
RBC: 4.74 MIL/uL (ref 3.80–5.10)
RDW: 16 % — ABNORMAL HIGH (ref 11.0–15.0)
WBC: 10.8 10*3/uL — AB (ref 4.0–10.5)

## 2016-12-19 LAB — COMPREHENSIVE METABOLIC PANEL
ALK PHOS: 59 U/L (ref 33–115)
ALT: 12 U/L (ref 6–29)
AST: 10 U/L (ref 10–30)
Albumin: 3.6 g/dL (ref 3.6–5.1)
BILIRUBIN TOTAL: 0.6 mg/dL (ref 0.2–1.2)
BUN: 11 mg/dL (ref 7–25)
CALCIUM: 8.7 mg/dL (ref 8.6–10.2)
CO2: 27 mmol/L (ref 20–31)
Chloride: 105 mmol/L (ref 98–110)
Creat: 0.58 mg/dL (ref 0.50–1.10)
Glucose, Bld: 86 mg/dL (ref 65–99)
Potassium: 4.2 mmol/L (ref 3.5–5.3)
Sodium: 137 mmol/L (ref 135–146)
TOTAL PROTEIN: 7.5 g/dL (ref 6.1–8.1)

## 2016-12-19 NOTE — Progress Notes (Signed)
   Subjective:    Patient ID: Brandi Durham, female    DOB: 1976/08/11, 41 y.o.   MRN: [id number]  HPI Chief Complaint  Patient presents with  . eye issues    eye issues and wants blood work   She is here to follow up on recent diagnosis of iritis by Dr. Katy Fitch. Dr. Katy Fitch forwarded necessary tests in order to look for underlying etiology for iritis.  She is under the care of Dr. Katy Fitch for iritis. She reports having an upcoming appointment with him later this week.   She would like to have a mass that has been diagnosed as a lipoma on her back removed. States she thinks it is getting larger and is becoming more bothersome.   History of chronic low back pain and is under the care of Dr. Cyndy Freeze for this.   A complete ROS is listed below.    Review of Systems Review of Systems Constitutional: -fever, -chills, -sweats, -unexpected weight change,-fatigue ENT: -runny nose, -ear pain, -sore throat Cardiology:  -chest pain, -palpitations, -edema Respiratory: -cough, -shortness of breath, -wheezing Gastroenterology: -abdominal pain, -nausea, -vomiting, -diarrhea, -constipation  Hematology: -bleeding or bruising problems Musculoskeletal: -arthralgias, -myalgias, -joint swelling, + lower back pain chronic Ophthalmology: +vision changes Urology: -dysuria, -difficulty urinating, -hematuria, -urinary frequency, -urgency Neurology: -headache, -weakness, -tingling, -numbness        Objective:   Physical Exam BP 110/70   Pulse 70   Temp 98.4 F (36.9 C) (Oral)   Wt 159 lb (72.1 kg)   BMI 27.29 kg/m   3 in x 3 in soft, round, non tender mass to the right of her spine on mid back region without erythema, induration, fluctuance or sign of infection.      Assessment & Plan:  Iritis of both eyes - Plan: CBC with Differential/Platelet, Comprehensive metabolic panel, ANA, RPR, C-reactive protein, Sedimentation rate, HLA-B27 Antigen, DG Chest 2 View, DG Lumbar Spine Complete, Lyme Ab/Western  Blot Reflex, Quantiferon tb gold assay (blood), Angiotensin converting enzyme  Chronic bilateral low back pain without sciatica - Plan: DG Lumbar Spine Complete  Lipoma of back - Plan: Ambulatory referral to General Surgery  Discussed that we will check blood work and obtain a CXR and lumbar XR to look for underlying etiology for recent diagnosis of iritis of both eyes. Recommended labs ordered per Dr. Katy Fitch. Will follow up pending results.  She will continue seeing Dr. Cyndy Freeze for chronic back pain.  Referral made to general surgery for consultation regarding mass on her back that appears to be a lipoma.

## 2016-12-19 NOTE — Patient Instructions (Signed)
We will call you with lab results.   You will receive a phone call from the surgeon's office.

## 2016-12-20 LAB — RPR

## 2016-12-20 LAB — C-REACTIVE PROTEIN: CRP: 12.8 mg/L — ABNORMAL HIGH (ref <8.0)

## 2016-12-20 LAB — SEDIMENTATION RATE: Sed Rate: 14 mm/hr (ref 0–20)

## 2016-12-20 LAB — ANGIOTENSIN CONVERTING ENZYME: ANGIOTENSIN-CONVERTING ENZYME: 28 U/L (ref 9–67)

## 2016-12-20 LAB — ANA: ANA: NEGATIVE

## 2016-12-20 LAB — LYME AB/WESTERN BLOT REFLEX

## 2016-12-22 LAB — QUANTIFERON TB GOLD ASSAY (BLOOD)
Interferon Gamma Release Assay: NEGATIVE
MITOGEN-NIL SO: 9.68 [IU]/mL
QUANTIFERON TB AG MINUS NIL: 0 [IU]/mL
Quantiferon Nil Value: 0.03 IU/mL

## 2016-12-25 LAB — HLA-B27 ANTIGEN: DNA Result:: NEGATIVE

## 2016-12-26 ENCOUNTER — Encounter: Payer: Self-pay | Admitting: Family Medicine

## 2016-12-26 ENCOUNTER — Encounter: Payer: Self-pay | Admitting: Internal Medicine

## 2016-12-26 ENCOUNTER — Other Ambulatory Visit: Payer: Self-pay | Admitting: Family Medicine

## 2016-12-26 DIAGNOSIS — R7982 Elevated C-reactive protein (CRP): Secondary | ICD-10-CM

## 2016-12-26 DIAGNOSIS — R937 Abnormal findings on diagnostic imaging of other parts of musculoskeletal system: Secondary | ICD-10-CM

## 2016-12-26 DIAGNOSIS — H2000 Unspecified acute and subacute iridocyclitis: Secondary | ICD-10-CM

## 2016-12-29 ENCOUNTER — Other Ambulatory Visit: Payer: Self-pay | Admitting: Family Medicine

## 2016-12-29 DIAGNOSIS — R937 Abnormal findings on diagnostic imaging of other parts of musculoskeletal system: Secondary | ICD-10-CM

## 2016-12-29 DIAGNOSIS — Z7952 Long term (current) use of systemic steroids: Secondary | ICD-10-CM

## 2017-01-02 ENCOUNTER — Ambulatory Visit
Admission: RE | Admit: 2017-01-02 | Discharge: 2017-01-02 | Disposition: A | Discharge status: Home / Self Care | Payer: Managed Care, Other (non HMO) | Source: Ambulatory Visit | Attending: Family Medicine | Admitting: Family Medicine

## 2017-01-02 ENCOUNTER — Other Ambulatory Visit: Payer: Managed Care, Other (non HMO)

## 2017-01-02 DIAGNOSIS — Z7952 Long term (current) use of systemic steroids: Secondary | ICD-10-CM

## 2017-01-02 DIAGNOSIS — R937 Abnormal findings on diagnostic imaging of other parts of musculoskeletal system: Secondary | ICD-10-CM

## 2017-01-23 ENCOUNTER — Other Ambulatory Visit: Payer: Self-pay | Admitting: General Surgery

## 2017-04-14 ENCOUNTER — Other Ambulatory Visit: Payer: Self-pay

## 2017-04-14 ENCOUNTER — Ambulatory Visit (INDEPENDENT_AMBULATORY_CARE_PROVIDER_SITE_OTHER): Payer: Managed Care, Other (non HMO) | Admitting: Medical

## 2017-04-14 ENCOUNTER — Encounter: Payer: Self-pay | Admitting: Medical

## 2017-04-14 VITALS — BP 142/80 | HR 89 | Wt 152.2 lb

## 2017-04-14 DIAGNOSIS — M542 Cervicalgia: Secondary | ICD-10-CM

## 2017-04-14 DIAGNOSIS — M549 Dorsalgia, unspecified: Secondary | ICD-10-CM | POA: Diagnosis not present

## 2017-04-14 DIAGNOSIS — M62838 Other muscle spasm: Secondary | ICD-10-CM | POA: Diagnosis not present

## 2017-04-14 DIAGNOSIS — M62830 Muscle spasm of back: Secondary | ICD-10-CM | POA: Insufficient documentation

## 2017-04-14 DIAGNOSIS — F17200 Nicotine dependence, unspecified, uncomplicated: Secondary | ICD-10-CM | POA: Diagnosis not present

## 2017-04-14 DIAGNOSIS — G8929 Other chronic pain: Secondary | ICD-10-CM

## 2017-04-14 DIAGNOSIS — H209 Unspecified iridocyclitis: Secondary | ICD-10-CM

## 2017-04-14 MED ORDER — HYDROCODONE-ACETAMINOPHEN 5-325 MG PO TABS: 1.0000 | ORAL_TABLET | Freq: Two times a day (BID) | ORAL | 0 refills | Status: DC

## 2017-04-14 MED ORDER — METHOCARBAMOL 500 MG PO TABS: 1000.0000 mg | ORAL_TABLET | Freq: Three times a day (TID) | ORAL | 0 refills | Status: DC | PRN

## 2017-04-14 NOTE — Progress Notes (Addendum)
Subjective: Chief Complaint  Patient presents with  . back pain    back pain spasms at night , lightinh bolt pain , trouble walking due to pain  x 4 days    Here for pain in back.  Started 4 days ago with pain from neck to low back throughout.   She notes spasm and stiffness in neck and back.  No particular recent injury or trauma.  She does lift boxes some at work, but no strenuous recent activity.  No aggravating factor recently.  She does have hx/o neck and back pain, hx/o 2 prior lumbar spine surgery and back in May of this year had lipoma removed from mid back.  She has had chronic pain in neck and back. Saw Dr. Christella Noa earlier this year, was advised to have updated MRIs but she couldn't afford them there at their office.   She is using OTC medication for pain without relief.    Denies urinary or bowel issues today, no fever, no blood in urine or stool.   No numbness, no tingling, no weakness.   Sees Dr. Katy Fitch for iritis which has flared up the last few days,  Using the drops he prescribed  No other aggravating or relieving factors. No other complaint.  Past Medical History:  Diagnosis Date  . Back problem    No current outpatient prescriptions on file prior to visit.   No current facility-administered medications on file prior to visit.    Past Surgical History:  Procedure Laterality Date  . ABDOMINAL HYSTERECTOMY    . ABDOMINAL SURGERY    . APPENDECTOMY    . BACK SURGERY    . CESAREAN SECTION    . KNEE SURGERY    . PARTIAL HYSTERECTOMY     ROS as in subjective    Objective: BP (!) 142/80   Pulse 89   Wt 152 lb 3.2 oz (69 kg)   SpO2 98%   BMI 26.13 kg/m   General appearance: alert,  WD/WN, seated with back rest up on exam table, seemingly in pain HEENT: normocephalic, sclerae anicteric, PERRLA, EOMi, nares patent, no discharge or erythema, pharynx normal Oral cavity: MMM, no lesions Neck: tender bilaterally and posterior, generally reduced ROM, otherwise no  lymphadenopathy, no thyromegaly, no masses Back: tender throughout, mid and lumbar surgical scars, quite limited ROM due to pain Musculoskeletal: arms nontender, no swelling, no obvious deformity, mild bilat thigh tenderness, but no deformity, no swelling, ROM seems to be WNL Extremities: no edema, no cyanosis, no clubbing Pulses: 2+ symmetric, upper and lower extremities, normal cap refill Neurological: alert, oriented x 3, CN2-12 intact, strength normal upper extremities and lower extremities, sensation normal throughout, DTRs 2+ throughout, no cerebellar signs, gait normal Psychiatric: normal affect, behavior normal, pleasant    Assessment: Encounter Diagnoses  Name Primary?  . Chronic neck pain   . Chronic bilateral back pain, unspecified back location   . Neck muscle spasm Yes  . Lumbar paraspinal muscle spasm   . TOBACCO DEPENDENCE   . Iritis      Plan I reviewed imaging in the chart from 2018 and 2015 MRIs.  Discussed her concerns, symptoms and exam.   I called Neurosurgery office ans spoke to nursing staff.  Her last visit was 03/2016, and she was advised to have updated imaging.  She couldn't afford this.  She was referred to PT a few month ago but she doesn't recall this.    We will go ahead with updated referral to physical  therapy.   medications short term below.  Note given for work today through Sunday.  advised gentle stretching, relative rest, and f/u with PT.   Recheck here in 46mo.  Iritis - c/t drops, f/u with eye doctor and reschedule with rheumatology  Tobacco use - advised cessation  Brandi Durham was seen today for back pain.  Diagnoses and all orders for this visit:  Neck muscle spasm -     AMB referral to rehabilitation  Chronic neck pain -     AMB referral to rehabilitation  Chronic bilateral back pain, unspecified back location -     AMB referral to rehabilitation  Lumbar paraspinal muscle spasm -     AMB referral to rehabilitation  TOBACCO  DEPENDENCE  Iritis  Other orders -     methocarbamol (ROBAXIN) 500 MG tablet; Take 2 tablets (1,000 mg total) by mouth every 8 (eight) hours as needed for muscle spasms. -     HYDROcodone-acetaminophen (NORCO/VICODIN) 5-325 MG tablet; Take 1 tablet by mouth 2 (two) times daily.

## 2017-04-17 ENCOUNTER — Telehealth: Payer: Self-pay | Admitting: Family Medicine

## 2017-04-17 ENCOUNTER — Other Ambulatory Visit: Payer: Self-pay | Admitting: Medical

## 2017-04-17 MED ORDER — CYCLOBENZAPRINE HCL 10 MG PO TABS: ORAL_TABLET | ORAL | 0 refills | Status: DC

## 2017-04-17 NOTE — Telephone Encounter (Signed)
Recv'd fax from [facility] stating Methocarbamol on back order need alternative

## 2017-04-17 NOTE — Telephone Encounter (Signed)
You saw her while I was out. Please advise. Thanks.

## 2017-04-17 NOTE — Telephone Encounter (Signed)
Flexeril sent as alternate

## 2017-06-13 ENCOUNTER — Telehealth: Payer: Self-pay | Admitting: Family Medicine

## 2017-06-13 NOTE — Telephone Encounter (Signed)
Pt states she called [facility] and they will not reschedule her appt because she no showed back in June after her back surgery, states they called her right after her back surgery and she was so out of it she doesn't remember them even calling her and she tried to explain that to them but they wouldn't accept that excuse.  She needs referral to a different Rheumatologist.

## 2017-06-13 NOTE — Telephone Encounter (Signed)
Called and notified pt that [facility] is only the other rheumatology in [place]. They will contact her.   Phone # [phone] Fax # [phone]

## 2017-11-20 ENCOUNTER — Telehealth: Payer: Self-pay | Admitting: Family Medicine

## 2017-11-20 MED ORDER — OSELTAMIVIR PHOSPHATE 75 MG PO CAPS: 75.0000 mg | ORAL_CAPSULE | Freq: Every day | ORAL | 0 refills | Status: DC

## 2017-11-20 NOTE — Telephone Encounter (Signed)
Ok to send in Tamiflu 75 mg once daily x 7 days. She must start this within 48 hours of exposure or this will not be helpful.

## 2017-11-20 NOTE — Telephone Encounter (Signed)
Pt called and stated her grand-daughter was diagnosed with the flu today. She dies live with her. Grandchild's ped doctor stated she should call her pcp and request tamiflu. [facility] uses [facility] [address]  Pt can be reached at [phone].

## 2017-11-20 NOTE — Telephone Encounter (Signed)
Done and pt was notified.  

## 2018-07-04 ENCOUNTER — Encounter: Payer: Self-pay | Admitting: Medical

## 2018-07-04 ENCOUNTER — Ambulatory Visit: Payer: Managed Care, Other (non HMO) | Admitting: Medical

## 2018-07-04 VITALS — BP 120/82 | HR 96 | Temp 98.6°F | Resp 16 | Ht 61.0 in | Wt 150.2 lb

## 2018-07-04 DIAGNOSIS — R6889 Other general symptoms and signs: Secondary | ICD-10-CM

## 2018-07-04 LAB — POCT INFLUENZA A/B
INFLUENZA B, POC: NEGATIVE
Influenza A, POC: NEGATIVE

## 2018-07-04 MED ORDER — BENZONATATE 200 MG PO CAPS: 200.0000 mg | ORAL_CAPSULE | Freq: Three times a day (TID) | ORAL | 0 refills | Status: DC | PRN

## 2018-07-04 MED ORDER — OSELTAMIVIR PHOSPHATE 75 MG PO CAPS: 75.0000 mg | ORAL_CAPSULE | Freq: Two times a day (BID) | ORAL | 0 refills | Status: DC

## 2018-07-04 NOTE — Progress Notes (Signed)
Subjective: Chief Complaint  Patient presents with  . cough    sore throat, cough congestion, achey, eyes burning X 1 day    Here for 1 day history of abrupt onset of body aches, chills, hot fevers, headache, stomach hurts from coughing so much, cough, heartburn.  Granddaughter has similar symptoms.  She is a smoker.  Taken off of her symptoms.  Been coughing all day.  No nausea vomiting urinary or bowel issues.  Past Medical History:  Diagnosis Date  . Back problem    Current Outpatient Medications on File Prior to Visit  Medication Sig Dispense Refill  . cyclobenzaprine (FLEXERIL) 10 MG tablet 1/2-1 tablet po BID prn for spasm, caution - sedation (Patient not taking: Reported on 07/04/2018) 20 tablet 0  . diphenhydrAMINE (BENADRYL) 25 MG tablet Take 25 mg by mouth every 6 (six) hours as needed.    Marland Kitchen HYDROcodone-acetaminophen (NORCO/VICODIN) 5-325 MG tablet Take 1 tablet by mouth 2 (two) times daily. (Patient not taking: Reported on 07/04/2018) 15 tablet 0   No current facility-administered medications on file prior to visit.    ROS as in subjective   Objective: BP 120/82   Pulse 96   Temp 98.6 F (37 C) (Oral)   Resp 16   Ht 5\' 1"  (1.549 m)   Wt 150 lb 3.2 oz (68.1 kg)   LMP 07/04/2018 (Exact Date)   SpO2 97%   BMI 28.38 kg/m   General: Ill-appearing, well-developed, well-nourished Skin: Hot, dry HEENT: Nose inflamed and congested, clear conjunctiva, TMs pearly, no sinus tenderness, pharynx with erythema, no exudates Neck: Supple, non tender, shotty cervical adenopathy Heart: Regular rate and rhythm, normal S1, S2, no murmurs Lungs: Clear to auscultation bilaterally, no wheezes, rales, rhonchi Abdomen: Mild generalized tenderness no mass no organomegaly  Extremities: Mild generalized tenderness    Assessment: Encounter Diagnosis  Name Primary?  . Influenza-like symptoms Yes    Plan: Will treat for flu like illness.   discussed treatment recommendations,  possible complications, gave note for work. F/u prn.  Patient Instructions  I recommend rest, hydration of water, soup, clear fluids to avoid dehydration  You can use Tylenol or ibuprofen for aches and pain and fever  You can use NyQuil at bedtime for stronger cough medicine or you can try the Tessalon Perles cough drops as sent to the pharmacy  Begin Tamiflu 1 tablet twice daily for 5 days to help with flulike symptoms  Self quarantine at home to avoid getting other people sick  If you are not seeing some improvement through the weekend, then call back or call after-hours line    Melonie was seen today for cough.  Diagnoses and all orders for this visit:  Influenza-like symptoms -     Influenza A/B  Other orders -     benzonatate (TESSALON) 200 MG capsule; Take 1 capsule (200 mg total) by mouth 3 (three) times daily as needed for cough. -     oseltamivir (TAMIFLU) 75 MG capsule; Take 1 capsule (75 mg total) by mouth 2 (two) times daily.

## 2018-07-04 NOTE — Patient Instructions (Signed)
I recommend rest, hydration of water, soup, clear fluids to avoid dehydration  You can use Tylenol or ibuprofen for aches and pain and fever  You can use NyQuil at bedtime for stronger cough medicine or you can try the Tessalon Perles cough drops as sent to the pharmacy  Begin Tamiflu 1 tablet twice daily for 5 days to help with flulike symptoms  Self quarantine at home to avoid getting other people sick  If you are not seeing some improvement through the weekend, then call back or call after-hours line

## 2018-08-21 ENCOUNTER — Encounter: Payer: Self-pay | Admitting: Family Medicine

## 2018-08-21 ENCOUNTER — Ambulatory Visit: Payer: 59 | Admitting: Family Medicine

## 2018-08-21 VITALS — BP 110/68 | HR 60 | Temp 98.0°F | Wt 151.0 lb

## 2018-08-21 DIAGNOSIS — L0231 Cutaneous abscess of buttock: Secondary | ICD-10-CM

## 2018-08-21 DIAGNOSIS — L03317 Cellulitis of buttock: Secondary | ICD-10-CM

## 2018-08-21 MED ORDER — DOXYCYCLINE HYCLATE 100 MG PO TABS: 100.0000 mg | ORAL_TABLET | Freq: Two times a day (BID) | ORAL | 0 refills | Status: DC

## 2018-08-21 NOTE — Progress Notes (Signed)
   Subjective:    Patient ID: Brandi Durham, female    DOB: 06/22/1976, 42 y.o.   MRN: [id number]  HPI She noticed pain and redness with swelling in the right buttock area yesterday.  It is been getting worse.   Review of Systems     Objective:   Physical Exam Exam of the right upper buttock shows a 4 cm area of erythema with a central whitish pustular lesion. The lesion was opened with a small needle and pus was removed.      Assessment & Plan:  Cellulitis and abscess of buttock - Plan: doxycycline (VIBRA-TABS) 100 MG tablet, WOUND CULTURE Explained that this might possibly turn into an abscess.  She will keep me informed as to how she is doing. Deferred taking care of the other complaints until she could return for more definitive work-up.

## 2018-08-24 ENCOUNTER — Encounter: Payer: Self-pay | Admitting: Family Medicine

## 2018-08-24 ENCOUNTER — Ambulatory Visit: Payer: 59 | Admitting: Family Medicine

## 2018-08-24 VITALS — BP 120/70 | HR 83 | Temp 98.0°F | Wt 149.8 lb

## 2018-08-24 DIAGNOSIS — L03317 Cellulitis of buttock: Secondary | ICD-10-CM

## 2018-08-24 DIAGNOSIS — L0231 Cutaneous abscess of buttock: Secondary | ICD-10-CM

## 2018-08-24 LAB — WOUND CULTURE

## 2018-08-24 MED ORDER — AZITHROMYCIN 500 MG PO TABS: 500.0000 mg | ORAL_TABLET | Freq: Every day | ORAL | 0 refills | Status: DC

## 2018-08-24 NOTE — Progress Notes (Signed)
   Subjective:    Patient ID: Brandi Durham, female    DOB: 02/21/1976, 42 y.o.   MRN: [id number]  HPI She is here for recheck.  She has had difficulty with the doxycycline causing nausea and some vomiting.  The lesion has become less erythematous.   Review of Systems     Objective:   Physical Exam Alert and in no distress.  The lesion on her buttock shows much less erythema.       Assessment & Plan:  Cellulitis and abscess of buttock - Plan: azithromycin (ZITHROMAX) 500 MG tablet Since she is having difficulty with doxycycline I will switch her to azithromycin.  Cautioned that this might not necessarily be as effective.  Culture results have been inconclusive at the present time.  She will keep me informed.

## 2018-11-22 ENCOUNTER — Ambulatory Visit: Payer: 59 | Admitting: Medical

## 2018-11-22 ENCOUNTER — Encounter: Payer: Self-pay | Admitting: Family Medicine

## 2018-11-22 ENCOUNTER — Encounter: Payer: Self-pay | Admitting: Medical

## 2018-11-22 ENCOUNTER — Other Ambulatory Visit: Payer: Self-pay

## 2018-11-22 VITALS — BP 110/70 | HR 80 | Temp 98.1°F | Resp 18 | Ht 61.0 in | Wt 151.4 lb

## 2018-11-22 DIAGNOSIS — J0110 Acute frontal sinusitis, unspecified: Secondary | ICD-10-CM | POA: Diagnosis not present

## 2018-11-22 MED ORDER — CLARITHROMYCIN 500 MG PO TABS: 500.0000 mg | ORAL_TABLET | Freq: Two times a day (BID) | ORAL | 0 refills | Status: DC

## 2018-11-22 MED ORDER — PSEUDOEPHEDRINE-GUAIFENESIN ER 60-600 MG PO TB12: 1.0000 | ORAL_TABLET | Freq: Two times a day (BID) | ORAL | 0 refills | Status: DC

## 2018-11-22 NOTE — Progress Notes (Signed)
Subjective:  Brandi Durham is a 43 y.o. female who presents for  Chief Complaint  Patient presents with  . Facial Pain    and pressure x 3 days. Mucus is clear. No fevers. Some slight sweating when she is sleeping. Coughs up mucus in the mornings.    She reports sinus pressure, headache, thick mucous in head, some cold symptoms, but no fever, no NVD, no rash, no sore throat, no ear pain.   Some cough.   Smoker.    No wheezing, no SOB.    No recent travel.   No known Coronavirus exposure.   No recent contacts with anyone traveling from [place] or [place].   Has used some Claritin and benadryl.  No other aggravating or relieving factors.  No other complaint.    Past Medical History:  Diagnosis Date  . Back problem     Current Outpatient Medications on File Prior to Visit  Medication Sig Dispense Refill  . cyclobenzaprine (FLEXERIL) 10 MG tablet 1/2-1 tablet po BID prn for spasm, caution - sedation 20 tablet 0  . diphenhydrAMINE (BENADRYL) 25 MG tablet Take 25 mg by mouth every 6 (six) hours as needed.    Marland Kitchen HYDROcodone-acetaminophen (NORCO/VICODIN) 5-325 MG tablet Take 1 tablet by mouth 2 (two) times daily. 15 tablet 0  . loratadine (CLARITIN) 10 MG tablet Take 10 mg by mouth daily.     No current facility-administered medications on file prior to visit.     ROS as in subjective   Objective: BP 110/70   Pulse 80   Temp 98.1 F (36.7 C) (Oral)   Resp 18   Ht 5\' 1"  (1.549 m)   Wt 151 lb 6.4 oz (68.7 kg)   LMP 11/14/2018 (Exact Date)   BMI 28.61 kg/m   General appearance: Alert, well developed, well nourished, no distress                             Skin: warm, no rash                           Head: +frontal sinus tenderness,                            Eyes: conjunctiva pink, corneas clear                            Ears: flat left tympanic membrane, flat right tympanic membrane, external ear canals normal                          Nose: septum midline, turbinates swollen, with  erythema and clear discharge             Mouth/throat: MMM, tongue normal, mild pharyngeal erythema                           Neck: supple, no adenopathy, no thyromegaly, non tender                         Lungs: clear, no wheezes, no rales, no rhonchi        Assessment  Encounter Diagnosis  Name Primary?  . Acute non-recurrent frontal sinusitis Yes      Plan: Discussed  diagnosis of sinusitis.   Discussed usual time frame to see improvement. Discussed possible complications or symptoms that would prompt call back or recheck within the next few days.     Medications prescribed:  Complete the course of Biaxin antibiotic prescribed today.   Begin Mucinex D  Specific home care recommendations discussed:  Pain/fever relief: You may use over-the-counter Tylenol for pain or fever  Drink extra fluids. Fluids help thin the mucus so your sinuses can drain more easily.   Patient was advised to call or return if worse or not improving in the next few days.    Patient voiced understanding of diagnosis, recommendations, and treatment plan.  Brandi Durham was seen today for facial pain.  Diagnoses and all orders for this visit:  Acute non-recurrent frontal sinusitis  Other orders -     clarithromycin (BIAXIN) 500 MG tablet; Take 1 tablet (500 mg total) by mouth 2 (two) times daily. -     pseudoephedrine-guaifenesin (MUCINEX D) 60-600 MG 12 hr tablet; Take 1 tablet by mouth every 12 (twelve) hours.

## 2018-11-22 NOTE — Patient Instructions (Signed)
Recommendations:  Rest  hydrate well with water  Consider nasal saline flush  Begin Mucinex D over the counter twice daily for 5 days  If not improving in 48 hours, begin Biaxin antibiotic    Using Saline Nose Drops with Bulb Syringe A bulb syringe is used to clear your nose. You may use it when you have a stuffy nose, nasal congestion, sinus pressure, or sneezing.   SALINE SOLUTION You can buy nose drops at your local drug store. You can also make nose drops yourself. Mix 1 cup of water with  teaspoon of salt. Stir. Store this mixture at room temperature. Make a new batch daily.  USE THE BULB IN COMBINATION WITH SALINE NOSE DROPS  Squeeze the air out of the bulb before suctioning the saline mixture.  While still squeezing the bulb flat, place the tip of the bulb into the saline mixture.  Let air come back into the bulb.  This will suction up the saline mixture.  Gently flush one nostril at a time.  Salt water nose drops will then moisten your  congested nose and loosen secretions before suctioning.  Use the bulb syringe as directed below to suction.  USING THE BULB SYRINGE TO SUCTION  While still squeezing the bulb flat, place the tip of the bulb into a nostril. Let air come back into the bulb. The suction will pull snot out of the nose and into the bulb.  Repeat on the other nostril.  Squeeze syringe several times into a tissue.  CLEANING THE BULB SYRINGE  Clean the bulb syringe every day with hot soapy water.  Clean the inside of the bulb by squeezing the bulb while the tip is in soapy water.  Rinse by squeezing the bulb while the tip is in clean hot water.  Store the bulb with the tip side down on paper towel.  HOME CARE INSTRUCTIONS   Use saline nose drops often to keep the nose open and not stuffy.  Throw away used salt water. Make a new solution every time.  Do not use the same solution and dropper for another person  If you do not prefer to use nasal  saline flush, other options include nasal saline spray or the [employer], both of which are available over the counter at your pharmacy.

## 2018-12-28 ENCOUNTER — Ambulatory Visit: Payer: 59 | Admitting: Medical

## 2018-12-28 ENCOUNTER — Other Ambulatory Visit: Payer: Self-pay

## 2018-12-28 VITALS — Temp 97.6°F | Ht 61.0 in | Wt 145.0 lb

## 2018-12-28 DIAGNOSIS — L923 Foreign body granuloma of the skin and subcutaneous tissue: Secondary | ICD-10-CM

## 2018-12-28 DIAGNOSIS — L089 Local infection of the skin and subcutaneous tissue, unspecified: Secondary | ICD-10-CM

## 2018-12-28 MED ORDER — MUPIROCIN 2 % EX OINT: 1.0000 "application " | TOPICAL_OINTMENT | Freq: Three times a day (TID) | CUTANEOUS | 0 refills | Status: DC

## 2018-12-28 MED ORDER — CLINDAMYCIN HCL 300 MG PO CAPS: 300.0000 mg | ORAL_CAPSULE | Freq: Three times a day (TID) | ORAL | 0 refills | Status: DC

## 2018-12-28 NOTE — Progress Notes (Signed)
  Subjective:     Patient ID: Brandi Durham, female   DOB: 10/05/1975, 43 y.o.   MRN: [id number]  This visit type was conducted due to national recommendations for restrictions regarding the COVID-19 Pandemic (e.g. social distancing) in an effort to limit this patient's exposure and mitigate transmission in our community.  This format is felt to be most appropriate for this patient at this time.    Documentation for virtual audio and video telecommunications through Zoom encounter:  The patient was located at home. The provider was located in the office. The patient did consent to this visit and is aware of possible charges through their insurance for this visit.  The other persons participating in this telemedicine service were none. Time spent on call was 15 minutes and in review of previous records >20 minutes total.  This virtual service is not related to other E/M service within previous 7 days.   HPI Chief Complaint  Patient presents with  . tattoo infected    tattoo infected X this am,  got tattoo a couple of days ago   Virtual visit today.  She had a tattoo done on her leg 4 days ago.  This was a large tattoo she is ever had and has had 15 tattoos prior.  This is on her lateral thigh.  She had the outlined in first followed the next day by the coloring.  Normally she waits more time between but this time with back to back next day.  She has never had inflammation or redness or pus from a tattoo prior.  However the day after the blue coloration, started getting redness and swelling in the blue areas.   This morning though started having pus in the puffy areas when pushing on the area.  She has never had this before.   No fever, no body aches or chills.   Using topical aquaphor.   No other aggravating or relieving factors. No other complaint.  She is a social smoker.  No hx/o diabetes or immunocompromised state  Review of Systems As in subjective    Objective:   Physical Exam   Temp 97.6 F (36.4 C) (Oral)   Ht 5\' 1"  (1.549 m)   Wt 145 lb (65.8 kg)   BMI 27.40 kg/m   Due to coronavirus pandemic stay at home measures, patient visit was virtual and they were not examined in person.   Skin: lateral thigh with blue coloration in tattoos with surrounding erythema and slight pus noted in a photo    Assessment:     Encounter Diagnoses  Name Primary?  Brandi Durham reaction Yes  . Skin infection        Plan:     Possible reaction to blue ink vs skin infection.  advised good hygiene, can use cool compresses, ibuprofen OTC 3 tablets BID, and begin medications below.  If no much improved within 3-4 days, call back.   Avoid blue ink with tattoos in the future, and avoid additional tattoo ink for the next 2 weeks.   Brandi Durham was seen today for tattoo infected.  Diagnoses and all orders for this visit:  Tattoo reaction  Skin infection  Other orders -     clindamycin (CLEOCIN) 300 MG capsule; Take 1 capsule (300 mg total) by mouth 3 (three) times daily. -     mupirocin ointment (BACTROBAN) 2 %; Place 1 application into the nose 3 (three) times daily.

## 2019-02-12 ENCOUNTER — Encounter: Payer: Self-pay | Admitting: Internal Medicine

## 2019-05-06 ENCOUNTER — Other Ambulatory Visit: Payer: Self-pay

## 2019-05-06 ENCOUNTER — Encounter: Payer: Self-pay | Admitting: Family Medicine

## 2019-05-06 ENCOUNTER — Ambulatory Visit: Payer: 59 | Admitting: Family Medicine

## 2019-05-06 VITALS — BP 110/62 | HR 67 | Temp 97.6°F | Wt 151.2 lb

## 2019-05-06 DIAGNOSIS — H538 Other visual disturbances: Secondary | ICD-10-CM

## 2019-05-06 DIAGNOSIS — H524 Presbyopia: Secondary | ICD-10-CM | POA: Diagnosis not present

## 2019-05-06 DIAGNOSIS — Z9109 Other allergy status, other than to drugs and biological substances: Secondary | ICD-10-CM | POA: Diagnosis not present

## 2019-05-06 DIAGNOSIS — Z8669 Personal history of other diseases of the nervous system and sense organs: Secondary | ICD-10-CM | POA: Insufficient documentation

## 2019-05-06 MED ORDER — LEVOCETIRIZINE DIHYDROCHLORIDE 5 MG PO TABS: 5.0000 mg | ORAL_TABLET | Freq: Every evening | ORAL | 1 refills | Status: DC

## 2019-05-06 NOTE — Progress Notes (Signed)
   Subjective:    Patient ID: Brandi Durham, female    DOB: 09/07/1976, 43 y.o.   MRN: [id number]  HPI Chief Complaint  Patient presents with  . vision    in am both eyes are blurring looking at phone or reading. saw Dr. Katy Durham last year for eye issues   Complains of a several month history of blurry vision while reading. This is gradually worsening. Normal vision otherwise.  No injury, foreign body sensation, drainage or eye pain.  Has not tried reading glasses.   Questionable dry eyes.  Denies polyuria, polydipsia. No hx of diabetes.   History of iritis and saw Dr. Katy Durham for this in 2018. States she would like to see a new eye doctor.   States she has a history of allergies. Being outside is a trigger.  Reports sneezing at times after eating and when she is outside.   Denies fever, chills, body aches, headache, dizziness, rhinorrhea, nasal congestion, sinus pain, sore throat, cough, shortness of breath.   Taking Zyrtec prn but does not think it helps. States she has taken this for years.  States she cannot use a nasal spray.   Denies ever having allergy testing.   Reviewed allergies, medications, past medical, surgical, family, and social history.    Review of Systems Pertinent positives and negatives in the history of present illness.     Objective:   Physical Exam Constitutional:      General: She is not in acute distress.    Appearance: Normal appearance. She is not ill-appearing.  Eyes:     General: Lids are normal. Vision grossly intact. No visual field deficit.    Extraocular Movements: Extraocular movements intact.     Conjunctiva/sclera: Conjunctivae normal.     Pupils: Pupils are equal, round, and reactive to light.  Cardiovascular:     Rate and Rhythm: Normal rate and regular rhythm.     Pulses: Normal pulses.  Pulmonary:     Effort: Pulmonary effort is normal.     Breath sounds: Normal breath sounds.  Lymphadenopathy:     Cervical: No cervical  adenopathy.  Skin:    General: Skin is warm and dry.  Neurological:     General: No focal deficit present.     Mental Status: She is alert and oriented to person, place, and time.     Cranial Nerves: Cranial nerves are intact.    BP 110/62   Pulse 67   Temp 97.6 F (36.4 C)   Wt 151 lb 3.2 oz (68.6 kg)   BMI 28.57 kg/m        Assessment & Plan:  Presbyopia of both eyes-no red flag symptoms.  She will call and schedule an appointment with a new eye doctor.  A list was provided. Visual acuity shows R eye 20/40, L eye 20/25 and both 20/20   History of iritis-does not appear to be related to iritis.  Suspect this is a benign condition.  Blurry vision, bilateral-no red flag symptoms.  Suspect presbyopia.  Environmental allergies - Plan: levocetirizine (XYZAL) 5 MG tablet-discussed avoiding triggers and showering after she has been outside.  She has been taking Zyrtec for years and it does not seem to be helping her any longer.  She declines nasal sprays.  Try Xyzal for now.

## 2019-05-06 NOTE — Patient Instructions (Signed)
Try the Xyzal allergy medication once daily.   Here is a list of Eye Doctors that you call and schedule an appointment with. You can schedule with any ophthalmologist including others not on this list   [facility] Address: [address], [place], [place] [address] Phone: [phone]   [facility] Address: [address], [place], [place] [address] Phone: [phone]   [facility] [address] [place], [place]  [address] Telephone: [phone]

## 2019-05-17 ENCOUNTER — Other Ambulatory Visit: Payer: Self-pay

## 2019-05-17 ENCOUNTER — Encounter: Payer: Self-pay | Admitting: Family Medicine

## 2019-05-17 ENCOUNTER — Ambulatory Visit: Payer: 59 | Admitting: Family Medicine

## 2019-05-17 VITALS — BP 100/78 | HR 89 | Temp 98.0°F | Ht 61.0 in | Wt 156.2 lb

## 2019-05-17 DIAGNOSIS — Z0000 Encounter for general adult medical examination without abnormal findings: Secondary | ICD-10-CM

## 2019-05-17 DIAGNOSIS — Z2821 Immunization not carried out because of patient refusal: Secondary | ICD-10-CM | POA: Diagnosis not present

## 2019-05-17 DIAGNOSIS — Z1329 Encounter for screening for other suspected endocrine disorder: Secondary | ICD-10-CM

## 2019-05-17 DIAGNOSIS — Z1239 Encounter for other screening for malignant neoplasm of breast: Secondary | ICD-10-CM | POA: Diagnosis not present

## 2019-05-17 NOTE — Progress Notes (Signed)
Subjective:    Patient ID: Brandi Durham, female    DOB: [birth year], 43 y.o.   MRN: [id number]  HPI Chief Complaint  Patient presents with  . Annual Exam   She is here for a complete physical exam. Last CPE: 2016  Other providers: Dr. Cyndy Freeze for back pain [facility] for her eyes.  History of iritis  Social history: Lives with spouse, works as a Biomedical scientist.  States she is starting a new job with [employer] next week. Alcohol use is weekly.  States she plans on drinking a lot this weekend due to it being a holiday. Has been smoking since age 19 but only smokes 3 or 4/day. Denies drug use  Diet: healthy per patient Excerise: Nothing currently  Immunizations: declines flu shot   Health maintenance:  Mammogram: Never Colonoscopy: Never Last Gynecological Exam: 2016 and refuses Pap smear today.  States she will schedule a separate visit for that. Last Menstrual cycle: 08/29 Last Dental Exam: annually  Last Eye Exam: upcoming appt at [facility]   Wears seatbelt always, uses sunscreen, smoke detectors in home and functioning, does not text while driving and feels safe in home environment.   Reviewed allergies, medications, past medical, surgical, family, and social history.    Review of Systems Review of Systems Constitutional: -fever, -chills, -sweats, -unexpected weight change,-fatigue ENT: -runny nose, -ear pain, -sore throat Cardiology:  -chest pain, -palpitations, -edema Respiratory: -cough, -shortness of breath, -wheezing Gastroenterology: -abdominal pain, -nausea, -vomiting, -diarrhea, -constipation  Hematology: -bleeding or bruising problems Musculoskeletal: -arthralgias, -myalgias, -joint swelling, + chronic back pain Ophthalmology: -vision changes Urology: -dysuria, -difficulty urinating, -hematuria, -urinary frequency, -urgency Neurology: -headache, -weakness, -tingling, -numbness       Objective:   Physical Exam BP 100/78   Pulse  89   Temp 98 F (36.7 C) (Oral)   Ht 5\' 1"  (1.549 m)   Wt 156 lb 3.2 oz (70.9 kg)   SpO2 98%   BMI 29.51 kg/m   General Appearance:    Alert, cooperative, no distress, appears stated age  Head:    Normocephalic, without obvious abnormality, atraumatic  Eyes:    PERRL, conjunctiva/corneas clear, EOM's intact, fundi    benign  Ears:    Normal TM's and external ear canals  Nose:   Mask in place   Throat:   Mask in place   Neck:   Supple, no lymphadenopathy;  thyroid:  no   enlargement/tenderness/nodules; no carotid   bruit or JVD  Back:    Spine nontender, no curvature, ROM normal, no CVA     tenderness  Lungs:     Clear to auscultation bilaterally without wheezes, rales or     ronchi; respirations unlabored  Chest Wall:    No tenderness or deformity   Heart:    Regular rate and rhythm, S1 and S2 normal, no murmur, rub   or gallop  Breast Exam:    Refuses. Will return for breast and pelvic. Mammogram ordered   Abdomen:     Soft, non-tender, nondistended, normoactive bowel sounds,    no masses, no hepatosplenomegaly  Genitalia:    Refuses. Will call to return for pap smear     Extremities:   No clubbing, cyanosis or edema  Pulses:   2+ and symmetric all extremities  Skin:   Skin color, texture, turgor normal, no rashes or lesions. Multiple tattoos   Lymph nodes:   Cervical, supraclavicular, and axillary nodes normal  Neurologic:   CNII-XII intact,  normal strength, sensation and gait; reflexes 2+ and symmetric throughout          Psych:   Normal mood, affect, hygiene and grooming.         Assessment & Plan:  Routine general medical examination at a health care facility - Plan: CBC with Differential/Platelet, Comprehensive metabolic panel, T4, free, T3, TSH, Lipid panel -He appears to be doing well overall.  She is not fasting today.  Discussed preventive healthcare.  She has never had a mammogram and is aware that she should call and schedule this.  She refuses Pap smear and is  aware that she is overdue.  Plans to return for a separate visit to do this.  Immunizations discussed.  She declines flu shot.  Discussed safety and health promotion.  Recommend healthy diet and getting at least 150 minutes of physical activity per week.  Screening for breast cancer-ordered her first mammogram at the breast center.  Follow-up pending results  Screening for thyroid disorder - Plan: T4, free, T3, TSH, MM DIGITAL SCREENING BILATERAL  Refused influenza vaccine  She is not fasting, ate prior to arrival.

## 2019-05-17 NOTE — Patient Instructions (Addendum)
Call and schedule your mammogram as discussed.   Remember to schedule your pap smear, this is overdue.     Preventive Care 53-45 Years Old, Female Preventive care refers to visits with your health care provider and lifestyle choices that can promote health and wellness. This includes:  A yearly physical exam. This may also be called an annual well check.  Regular dental visits and eye exams.  Immunizations.  Screening for certain conditions.  Healthy lifestyle choices, such as eating a healthy diet, getting regular exercise, not using drugs or products that contain nicotine and tobacco, and limiting alcohol use. What can I expect for my preventive care visit? Physical exam Your health care provider will check your:  Height and weight. This may be used to calculate body mass index (BMI), which tells if you are at a healthy weight.  Heart rate and blood pressure.  Skin for abnormal spots. Counseling Your health care provider may ask you questions about your:  Alcohol, tobacco, and drug use.  Emotional well-being.  Home and relationship well-being.  Sexual activity.  Eating habits.  Work and work Statistician.  Method of birth control.  Menstrual cycle.  Pregnancy history. What immunizations do I need?  Influenza (flu) vaccine  This is recommended every year. Tetanus, diphtheria, and pertussis (Tdap) vaccine  You may need a Td booster every 10 years. Varicella (chickenpox) vaccine  You may need this if you have not been vaccinated. Zoster (shingles) vaccine  You may need this after age 57. Measles, mumps, and rubella (MMR) vaccine  You may need at least one dose of MMR if you were born in 1957 or later. You may also need a second dose. Pneumococcal conjugate (PCV13) vaccine  You may need this if you have certain conditions and were not previously vaccinated. Pneumococcal polysaccharide (PPSV23) vaccine  You may need one or two doses if you smoke  cigarettes or if you have certain conditions. Meningococcal conjugate (MenACWY) vaccine  You may need this if you have certain conditions. Hepatitis A vaccine  You may need this if you have certain conditions or if you travel or work in places where you may be exposed to hepatitis A. Hepatitis B vaccine  You may need this if you have certain conditions or if you travel or work in places where you may be exposed to hepatitis B. Haemophilus influenzae type b (Hib) vaccine  You may need this if you have certain conditions. Human papillomavirus (HPV) vaccine  If recommended by your health care provider, you may need three doses over 6 months. You may receive vaccines as individual doses or as more than one vaccine together in one shot (combination vaccines). Talk with your health care provider about the risks and benefits of combination vaccines. What tests do I need? Blood tests  Lipid and cholesterol levels. These may be checked every 5 years, or more frequently if you are over 1 years old.  Hepatitis C test.  Hepatitis B test. Screening  Lung cancer screening. You may have this screening every year starting at age 15 if you have a 30-pack-year history of smoking and currently smoke or have quit within the past 15 years.  Colorectal cancer screening. All adults should have this screening starting at age 12 and continuing until age 72. Your health care provider may recommend screening at age 74 if you are at increased risk. You will have tests every 1-10 years, depending on your results and the type of screening test.  Diabetes screening.  This is done by checking your blood sugar (glucose) after you have not eaten for a while (fasting). You may have this done every 1-3 years.  Mammogram. This may be done every 1-2 years. Talk with your health care provider about when you should start having regular mammograms. This may depend on whether you have a family history of breast cancer.   BRCA-related cancer screening. This may be done if you have a family history of breast, ovarian, tubal, or peritoneal cancers.  Pelvic exam and Pap test. This may be done every 3 years starting at age 17. Starting at age 66, this may be done every 5 years if you have a Pap test in combination with an HPV test. Other tests  Sexually transmitted disease (STD) testing.  Bone density scan. This is done to screen for osteoporosis. You may have this scan if you are at high risk for osteoporosis. Follow these instructions at home: Eating and drinking  Eat a diet that includes fresh fruits and vegetables, whole grains, lean protein, and low-fat dairy.  Take vitamin and mineral supplements as recommended by your health care provider.  Do not drink alcohol if: ? Your health care provider tells you not to drink. ? You are pregnant, may be pregnant, or are planning to become pregnant.  If you drink alcohol: ? Limit how much you have to 0-1 drink a day. ? Be aware of how much alcohol is in your drink. In the [place] one drink equals one 12 oz bottle of beer (355 mL), one 5 oz glass of wine (148 mL), or one 1 oz glass of hard liquor (44 mL). Lifestyle  Take daily care of your teeth and gums.  Stay active. Exercise for at least 30 minutes on 5 or more days each week.  Do not use any products that contain nicotine or tobacco, such as cigarettes, e-cigarettes, and chewing tobacco. If you need help quitting, ask your health care provider.  If you are sexually active, practice safe sex. Use a condom or other form of birth control (contraception) in order to prevent pregnancy and STIs (sexually transmitted infections).  If told by your health care provider, take low-dose aspirin daily starting at age 31. What's next?  Visit your health care provider once a year for a well check visit.  Ask your health care provider how often you should have your eyes and teeth checked.  Stay up to date on all  vaccines. This information is not intended to replace advice given to you by your health care provider. Make sure you discuss any questions you have with your health care provider. Document Released: 09/25/2015 Document Revised: 05/10/2018 Document Reviewed: 05/10/2018 Elsevier Patient Education  2020 [employer].

## 2019-05-18 LAB — CBC WITH DIFFERENTIAL/PLATELET
Basophils Absolute: 0.1 10*3/uL (ref 0.0–0.2)
Basos: 1 %
EOS (ABSOLUTE): 0.1 10*3/uL (ref 0.0–0.4)
Eos: 1 %
Hematocrit: 35.3 % (ref 34.0–46.6)
Hemoglobin: 11.2 g/dL (ref 11.1–15.9)
Immature Grans (Abs): 0 10*3/uL (ref 0.0–0.1)
Immature Granulocytes: 0 %
Lymphocytes Absolute: 3 10*3/uL (ref 0.7–3.1)
Lymphs: 28 %
MCH: 25.1 pg — ABNORMAL LOW (ref 26.6–33.0)
MCHC: 31.7 g/dL (ref 31.5–35.7)
MCV: 79 fL (ref 79–97)
Monocytes Absolute: 0.6 10*3/uL (ref 0.1–0.9)
Monocytes: 6 %
Neutrophils Absolute: 7 10*3/uL (ref 1.4–7.0)
Neutrophils: 64 %
Platelets: 539 10*3/uL — ABNORMAL HIGH (ref 150–450)
RBC: 4.47 x10E6/uL (ref 3.77–5.28)
RDW: 15.8 % — ABNORMAL HIGH (ref 11.7–15.4)
WBC: 10.8 10*3/uL (ref 3.4–10.8)

## 2019-05-18 LAB — TSH: TSH: 1.53 u[IU]/mL (ref 0.450–4.500)

## 2019-05-18 LAB — COMPREHENSIVE METABOLIC PANEL
ALT: 12 IU/L (ref 0–32)
AST: 14 IU/L (ref 0–40)
Albumin/Globulin Ratio: 1.1 — ABNORMAL LOW (ref 1.2–2.2)
Albumin: 4 g/dL (ref 3.8–4.8)
Alkaline Phosphatase: 68 IU/L (ref 39–117)
BUN/Creatinine Ratio: 16 (ref 9–23)
BUN: 9 mg/dL (ref 6–24)
Bilirubin Total: 0.2 mg/dL (ref 0.0–1.2)
CO2: 23 mmol/L (ref 20–29)
Calcium: 9.2 mg/dL (ref 8.7–10.2)
Chloride: 102 mmol/L (ref 96–106)
Creatinine, Ser: 0.56 mg/dL — ABNORMAL LOW (ref 0.57–1.00)
GFR calc Af Amer: 130 mL/min/{1.73_m2} (ref >59)
GFR calc non Af Amer: 113 mL/min/{1.73_m2} (ref >59)
Globulin, Total: 3.5 g/dL (ref 1.5–4.5)
Glucose: 103 mg/dL — ABNORMAL HIGH (ref 65–99)
Potassium: 4.4 mmol/L (ref 3.5–5.2)
Sodium: 138 mmol/L (ref 134–144)
Total Protein: 7.5 g/dL (ref 6.0–8.5)

## 2019-05-18 LAB — LIPID PANEL
Chol/HDL Ratio: 4.2 ratio (ref 0.0–4.4)
Cholesterol, Total: 197 mg/dL (ref 100–199)
HDL: 47 mg/dL (ref >39)
LDL Chol Calc (NIH): 116 mg/dL — ABNORMAL HIGH (ref 0–99)
Triglycerides: 193 mg/dL — ABNORMAL HIGH (ref 0–149)
VLDL Cholesterol Cal: 34 mg/dL (ref 5–40)

## 2019-05-18 LAB — T4, FREE: Free T4: 0.97 ng/dL (ref 0.82–1.77)

## 2019-05-18 LAB — T3: T3, Total: 114 ng/dL (ref 71–180)

## 2019-05-20 ENCOUNTER — Encounter: Payer: Self-pay | Admitting: Family Medicine

## 2019-05-20 DIAGNOSIS — D473 Essential (hemorrhagic) thrombocythemia: Secondary | ICD-10-CM

## 2019-05-20 DIAGNOSIS — D75839 Thrombocytosis, unspecified: Secondary | ICD-10-CM

## 2019-05-20 HISTORY — DX: Essential (hemorrhagic) thrombocythemia: D47.3

## 2019-05-20 HISTORY — DX: Thrombocytosis, unspecified: D75.839

## 2019-05-21 ENCOUNTER — Other Ambulatory Visit: Payer: Self-pay | Admitting: Medical

## 2019-05-21 ENCOUNTER — Telehealth: Payer: Self-pay | Admitting: Family Medicine

## 2019-05-21 MED ORDER — MUPIROCIN 2 % EX OINT: 1.0000 "application " | TOPICAL_OINTMENT | Freq: Three times a day (TID) | CUTANEOUS | 0 refills | Status: DC

## 2019-05-21 NOTE — Telephone Encounter (Signed)
She is getting her tattoo colored in and she says mupirocin 2% 2-3 times a day. shane filled it back in may or June she says

## 2019-05-21 NOTE — Telephone Encounter (Signed)
shane sent this in as I sent him this by mistake since this was last refilled by him

## 2019-05-21 NOTE — Telephone Encounter (Signed)
Med sent.

## 2019-05-21 NOTE — Telephone Encounter (Signed)
Pt called and wanted to know if she could get a refill on a cream that was prescribed to her when she had an infected tattoo

## 2019-05-21 NOTE — Telephone Encounter (Signed)
Can you find out which one she is referring to and I am ok refilling it but ask her what she is needing it for.

## 2019-05-22 LAB — FERRITIN: Ferritin: 15 ng/mL (ref 15–150)

## 2019-05-22 LAB — SPECIMEN STATUS REPORT

## 2019-05-22 LAB — IRON AND TIBC
Iron Saturation: 7 % — CL (ref 15–55)
Iron: 22 ug/dL — ABNORMAL LOW (ref 27–159)
Total Iron Binding Capacity: 301 ug/dL (ref 250–450)
UIBC: 279 ug/dL (ref 131–425)

## 2019-05-24 ENCOUNTER — Telehealth: Payer: Self-pay | Admitting: Family Medicine

## 2019-05-24 MED ORDER — IRON 325 (65 FE) MG PO TABS: ORAL_TABLET | ORAL | 1 refills | Status: DC

## 2019-05-24 NOTE — Telephone Encounter (Signed)
done

## 2019-05-24 NOTE — Telephone Encounter (Signed)
Pt called and wanted to know if she could get a prescription for the iron pills so her insurance can pay for them instead of paying for them over the counter. She uses the [facility] on [address]

## 2019-05-24 NOTE — Telephone Encounter (Signed)
Ok to send in. See me

## 2019-05-28 ENCOUNTER — Other Ambulatory Visit: Payer: Self-pay | Admitting: Family Medicine

## 2019-05-28 DIAGNOSIS — Z9109 Other allergy status, other than to drugs and biological substances: Secondary | ICD-10-CM

## 2019-06-03 ENCOUNTER — Telehealth: Payer: Self-pay | Admitting: Internal Medicine

## 2019-06-03 ENCOUNTER — Encounter: Payer: 59 | Admitting: Family Medicine

## 2019-06-03 NOTE — Progress Notes (Deleted)
   Subjective:    Patient ID: Brandi Durham, female    DOB: 13-Feb-1976, 43 y.o.   MRN: [id number]  HPI    Review of Systems     Objective:   Physical Exam        Assessment & Plan:

## 2019-06-03 NOTE — Telephone Encounter (Signed)

## 2019-06-03 NOTE — Telephone Encounter (Signed)
Follow up advised for pap smear. At her convenience

## 2019-06-04 ENCOUNTER — Other Ambulatory Visit: Payer: Self-pay

## 2019-06-04 DIAGNOSIS — Z20822 Contact with and (suspected) exposure to covid-19: Secondary | ICD-10-CM

## 2019-06-04 NOTE — Telephone Encounter (Signed)
Forwarding to Shauna °

## 2019-06-06 LAB — NOVEL CORONAVIRUS, NAA: SARS-CoV-2, NAA: NOT DETECTED

## 2019-06-12 ENCOUNTER — Encounter: Payer: Self-pay | Admitting: Family Medicine

## 2019-06-12 ENCOUNTER — Ambulatory Visit: Payer: 59 | Admitting: Family Medicine

## 2019-06-12 ENCOUNTER — Other Ambulatory Visit: Payer: Self-pay

## 2019-06-12 ENCOUNTER — Other Ambulatory Visit (HOSPITAL_COMMUNITY)
Admission: RE | Admit: 2019-06-12 | Discharge: 2019-06-12 | Disposition: A | Discharge status: Home / Self Care | Payer: Managed Care, Other (non HMO) | Source: Ambulatory Visit | Attending: Family Medicine | Admitting: Family Medicine

## 2019-06-12 VITALS — Temp 98.4°F | Wt 155.6 lb

## 2019-06-12 DIAGNOSIS — Z113 Encounter for screening for infections with a predominantly sexual mode of transmission: Secondary | ICD-10-CM

## 2019-06-12 DIAGNOSIS — Z8669 Personal history of other diseases of the nervous system and sense organs: Secondary | ICD-10-CM | POA: Diagnosis not present

## 2019-06-12 DIAGNOSIS — Z23 Encounter for immunization: Secondary | ICD-10-CM | POA: Diagnosis not present

## 2019-06-12 DIAGNOSIS — D75839 Thrombocytosis, unspecified: Secondary | ICD-10-CM

## 2019-06-12 DIAGNOSIS — D473 Essential (hemorrhagic) thrombocythemia: Secondary | ICD-10-CM

## 2019-06-12 DIAGNOSIS — Z124 Encounter for screening for malignant neoplasm of cervix: Secondary | ICD-10-CM | POA: Diagnosis not present

## 2019-06-12 NOTE — Patient Instructions (Signed)
Continue taking the iron supplement daily until you return for labs in 4-6 weeks. If you have constipation with the iron, you can add a stool softener. Let me know if you stop the iron for any reason.

## 2019-06-12 NOTE — Progress Notes (Signed)
   Subjective:    Patient ID: Brandi Durham, female    DOB: 02-12-1976, 43 y.o.   MRN: [id number]  HPI Chief Complaint  Patient presents with  . pap smear    pap smear, flu shot given   She is here for a pap smear. Her CPE was earlier this month and her last pap smear was in August 2016.  Requests STD screening.   She has not scheduled her mammogram yet but plans to do this today.   Denies fever, chills, chest pain, shortness of breath, abdominal pain, N/V/D. No urinary symptoms.  Denies any bleeding or bruising concerns.   Discussed abnormal labs and thrombocytosis with low iron.  She is taking an iron supplement. Started on oral iron last week.   Her periods are heavy. 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 She is getting glasses. States her doctor told her the iritis damaged her left eye.  No other issues.    Review of Systems Pertinent positives and negatives in the history of present illness.     Objective:   Physical Exam Exam conducted with a chaperone present.  Abdominal:     General: Abdomen is flat. Bowel sounds are normal.     Palpations: Abdomen is soft.     Tenderness: There is no abdominal tenderness.  Genitourinary:    Labia:        Right: No rash, tenderness or lesion.        Left: No rash, tenderness or lesion.      Vagina: Normal.     Cervix: Normal.     Uterus: Normal.      Adnexa: Right adnexa normal and left adnexa normal.    Temp 98.4 F (36.9 C)   Wt 155 lb 9.6 oz (70.6 kg)   LMP 06/08/2019   BMI 29.40 kg/m       Assessment & Plan:  Encounter for Papanicolaou smear for cervical cancer screening - Plan: Cytology - PAP() -chaperone present. Follow up pending results.   Thrombocytosis ([place])- in the process of working this up. Currently taking oral iron due to low iron stores. She will return in 4 weeks to recheck labs.   History of iritis- followed by eye doctor. consider as  possible contributer to thrombocytosis.   Screen for STD (sexually transmitted disease) - Plan: RPR, HIV Antibody (routine testing w rflx)  Needs flu shot - Plan: Flu Vaccine QUAD 36+ mos IM

## 2019-06-13 LAB — RPR: RPR Ser Ql: NONREACTIVE

## 2019-06-13 LAB — HIV ANTIBODY (ROUTINE TESTING W REFLEX): HIV Screen 4th Generation wRfx: NONREACTIVE

## 2019-06-17 LAB — CYTOLOGY - PAP
Adequacy: ABSENT
Chlamydia: NEGATIVE
Diagnosis: NEGATIVE
High risk HPV: NEGATIVE
Neisseria Gonorrhea: NEGATIVE
Trichomonas: NEGATIVE

## 2019-06-19 ENCOUNTER — Other Ambulatory Visit: Payer: Self-pay | Admitting: Family Medicine

## 2019-06-19 NOTE — Telephone Encounter (Signed)
Pt has an appt in november 

## 2019-06-27 ENCOUNTER — Other Ambulatory Visit: Payer: Self-pay | Admitting: Family Medicine

## 2019-06-27 DIAGNOSIS — Z9109 Other allergy status, other than to drugs and biological substances: Secondary | ICD-10-CM

## 2019-07-11 ENCOUNTER — Other Ambulatory Visit: Payer: Self-pay | Admitting: Family Medicine

## 2019-07-15 ENCOUNTER — Ambulatory Visit: Payer: 59 | Admitting: Family Medicine

## 2019-07-18 ENCOUNTER — Other Ambulatory Visit: Payer: Self-pay

## 2019-07-18 ENCOUNTER — Ambulatory Visit: Payer: 59 | Admitting: Family Medicine

## 2019-07-18 ENCOUNTER — Encounter: Payer: Self-pay | Admitting: Family Medicine

## 2019-07-18 VITALS — BP 120/70 | HR 74 | Temp 97.6°F | Wt 157.6 lb

## 2019-07-18 DIAGNOSIS — E611 Iron deficiency: Secondary | ICD-10-CM

## 2019-07-18 DIAGNOSIS — D473 Essential (hemorrhagic) thrombocythemia: Secondary | ICD-10-CM

## 2019-07-18 DIAGNOSIS — D75839 Thrombocytosis, unspecified: Secondary | ICD-10-CM

## 2019-07-18 NOTE — Progress Notes (Signed)
   Subjective:    Patient ID: Brandi Durham, female    DOB: Oct 07, 1975, 43 y.o.   MRN: [id number]  HPI Chief Complaint  Patient presents with  . follow-up on iron    follow-up on iron   Here today to follow up on iron deficiency and thrombocytosis.   Taking oral iron most days since her CPE. No issues with constipation. Reports having increased energy.   LMP: early October   States she stopped smoking 3 weeks ago.  Feeling much better. No longer waking up coughing.    Mammogram scheduled for 07/30/2019  No new concerns.   Denies fever, chills, dizziness, chest pain, palpitations, shortness of breath, abdominal pain, N/V/D, urinary symptoms, LE edema.     Review of Systems Pertinent positives and negatives in the history of present illness.     Objective:   Physical Exam BP 120/70   Pulse 74   Temp 97.6 F (36.4 C)   Wt 157 lb 9.6 oz (71.5 kg)   LMP 06/19/2019   BMI 29.78 kg/m   Alert and in no distress. Normal palpebral conjunctiva.  Cardiac exam shows a regular sinus rhythm without murmurs or gallops. Lungs are clear to auscultation. Skin is warm and dry, no pallor or rash.        Assessment & Plan:  Iron deficiency - Plan: CBC with Differential, Comprehensive metabolic panel, Iron, TIBC and Ferritin Panel  Thrombocytosis ([facility]) - Plan: CBC with Differential, Comprehensive metabolic panel, Iron, TIBC and Ferritin Panel  Congratulated her on stopping smoking.  She reports feeling improved with increased energy.  Continue on oral iron and follow up pending labs.

## 2019-07-19 LAB — CBC WITH DIFFERENTIAL/PLATELET
Basophils Absolute: 0.1 10*3/uL (ref 0.0–0.2)
Basos: 1 %
EOS (ABSOLUTE): 0.1 10*3/uL (ref 0.0–0.4)
Eos: 1 %
Hematocrit: 37.2 % (ref 34.0–46.6)
Hemoglobin: 11.5 g/dL (ref 11.1–15.9)
Immature Grans (Abs): 0 10*3/uL (ref 0.0–0.1)
Immature Granulocytes: 0 %
Lymphocytes Absolute: 2.5 10*3/uL (ref 0.7–3.1)
Lymphs: 27 %
MCH: 24.7 pg — ABNORMAL LOW (ref 26.6–33.0)
MCHC: 30.9 g/dL — ABNORMAL LOW (ref 31.5–35.7)
MCV: 80 fL (ref 79–97)
Monocytes Absolute: 0.6 10*3/uL (ref 0.1–0.9)
Monocytes: 6 %
Neutrophils Absolute: 6.2 10*3/uL (ref 1.4–7.0)
Neutrophils: 65 %
Platelets: 513 10*3/uL — ABNORMAL HIGH (ref 150–450)
RBC: 4.65 x10E6/uL (ref 3.77–5.28)
RDW: 16.6 % — ABNORMAL HIGH (ref 11.7–15.4)
WBC: 9.5 10*3/uL (ref 3.4–10.8)

## 2019-07-19 LAB — COMPREHENSIVE METABOLIC PANEL
ALT: 11 IU/L (ref 0–32)
AST: 14 IU/L (ref 0–40)
Albumin/Globulin Ratio: 1.2 (ref 1.2–2.2)
Albumin: 4.1 g/dL (ref 3.8–4.8)
Alkaline Phosphatase: 72 IU/L (ref 39–117)
BUN/Creatinine Ratio: 12 (ref 9–23)
BUN: 8 mg/dL (ref 6–24)
Bilirubin Total: 0.5 mg/dL (ref 0.0–1.2)
CO2: 19 mmol/L — ABNORMAL LOW (ref 20–29)
Calcium: 9.2 mg/dL (ref 8.7–10.2)
Chloride: 105 mmol/L (ref 96–106)
Creatinine, Ser: 0.65 mg/dL (ref 0.57–1.00)
GFR calc Af Amer: 124 mL/min/{1.73_m2} (ref >59)
GFR calc non Af Amer: 108 mL/min/{1.73_m2} (ref >59)
Globulin, Total: 3.5 g/dL (ref 1.5–4.5)
Glucose: 94 mg/dL (ref 65–99)
Potassium: 4.8 mmol/L (ref 3.5–5.2)
Sodium: 139 mmol/L (ref 134–144)
Total Protein: 7.6 g/dL (ref 6.0–8.5)

## 2019-07-19 LAB — IRON,TIBC AND FERRITIN PANEL
Ferritin: 18 ng/mL (ref 15–150)
Iron Saturation: 18 % (ref 15–55)
Iron: 55 ug/dL (ref 27–159)
Total Iron Binding Capacity: 309 ug/dL (ref 250–450)
UIBC: 254 ug/dL (ref 131–425)

## 2019-07-22 ENCOUNTER — Other Ambulatory Visit: Payer: Self-pay | Admitting: Internal Medicine

## 2019-07-22 DIAGNOSIS — D473 Essential (hemorrhagic) thrombocythemia: Secondary | ICD-10-CM

## 2019-07-22 DIAGNOSIS — D75839 Thrombocytosis, unspecified: Secondary | ICD-10-CM

## 2019-07-23 ENCOUNTER — Other Ambulatory Visit: Payer: Self-pay | Admitting: Family Medicine

## 2019-07-23 DIAGNOSIS — Z9109 Other allergy status, other than to drugs and biological substances: Secondary | ICD-10-CM

## 2019-07-25 ENCOUNTER — Telehealth: Payer: Self-pay | Admitting: Adult Health

## 2019-07-25 NOTE — Telephone Encounter (Signed)
Ms. Brandi Durham returned my call to schedule a hem appt for thrombocytosis. Ms. Brandi Durham has been scheduled to see Wilber Bihari on 11/17 at 11:30am w/labs at 11am. Pt aware to arrive 15 minutes early.

## 2019-07-29 NOTE — Progress Notes (Addendum)
Brandi Durham  [phone] [phone]     Brandi Durham DOB: 24-Aug-1976  MR#: [id number]  SWN#:[id number]  Patient Care Team: Girtha Rm, NP-C as PCP - [place], NP OTHER MD:  CHIEF COMPLAINT: iron deficiency, and thrombocytosis  CURRENT TREATMENT: oral iron   HISTORY OF CURRENT ILLNESS: April notes that she is here today because of elevated platelets and iron deficiency.  She was seen by a nurse practitioner and started on iron daily.  She underwent repeat lab testing and her plts were still elevated and she was referred to hematology.  The patient's subsequent history is as detailed below.  INTERVAL HISTORY: April is taking ferrous sulfate daily, she denies any blood in her stool or heavy periods.  She was started on iron in 05/2019 and her iron has improved since then, however the thrombocytosis has remained.  April notes that her periods go for a long time.  Her most recent cycle started on 11/5 and lasted until 11/14.  On days 1-4, she goes through 12-15 super plus tampons.  On days 5-7 she goes through 4-5 per day, and on the last two days she goes through one or two. She takes an iron tablet daily and tolerates this well.     She has not yet had a mammogram and is scheduled to undergo this 09/18/2019.  She recently quit smoking.  She smoked 1ppd at age 56 through age 52, and then transitioned to 1 pack per week.  She is feeling improved since quitting smoking.    REVIEW OF SYSTEMS:  April has chronic back pain and is s/p several back surgeries.  She does not have dizziness, fever, chills, chest pain, palpitations, fatigue, cough, shortness of breath, bowel/bladder changes, nausea, or vomiting.  A detailed ROS was otherwise non contributory.    PAST MEDICAL HISTORY: Past Medical History:  Diagnosis Date  . Back problem   . History of iritis   . Thrombocytosis ([place]) 05/20/2019    PAST SURGICAL HISTORY: Past Surgical  History:  Procedure Laterality Date  . ABDOMINAL HYSTERECTOMY    . ABDOMINAL SURGERY    . APPENDECTOMY    . BACK SURGERY    . CESAREAN SECTION    . KNEE SURGERY    . PARTIAL HYSTERECTOMY      FAMILY HISTORY Family History  Problem Relation Age of Onset  . Heart failure Father 50       Heart Failure  . Diabetes Mother   . Hypertension Mother   . Drug abuse Mother   . Heart attack Mother   . CAD Mother 87    GYNECOLOGIC HISTORY:  No LMP recorded. Menarche: 43 years old Age at first live birth: 43 years old GX P 1 (twins) LMP 11/5 (lasted until 11/13 or 11/14) Contraceptive, took years ago HRT no Hysterectomy? Partial? Salpingo-oophorectomy?no    SOCIAL HISTORY: April is unemployed.  She is a Biomedical scientist.  She has been unemployed since March of 2020.  She is now trying to find a new job.  She is keeping her grandchildren in her spare time.  She lives at home with her husband Izora Gala of 5 years in [place], [place].  She has two daughters and two granddaughters.  One is in [place], and the other in [place].  She drinks one day out of the weekend and will typically have 3 drinks.  Other nights of the week she may have one drink, or not drink at all.  She is a former smoker.  She quit about 3 weeks ago.  She denies any drug use.     ADVANCED DIRECTIVES: Not in place   HEALTH MAINTENANCE: Social History   Tobacco Use  . Smoking status: Former Smoker    Packs/day: 0.20    Years: 29.00    Pack years: 5.80    Types: Cigarettes    Quit date: 07/04/2019    Years since quitting: 0.0  . Smokeless tobacco: Never Used  Substance Use Topics  . Alcohol use: Yes    Comment: once per week   . Drug use: No     Colonoscopy: not yet  PAP: 2020  Bone density: 2018-normal   Allergies  Allergen Reactions  . Doxycycline     nausea  . Penicillins Swelling  . Sulfur Hives    Current Outpatient Medications  Medication Sig Dispense Refill  . ferrous sulfate 325 (65 FE) MG  tablet TAKE 1 TABLET BY MOUTH EVERY DAY 30 tablet 2  . levocetirizine (XYZAL) 5 MG tablet TAKE 1 TABLET BY MOUTH EVERY DAY IN THE EVENING 30 tablet 1  . mupirocin ointment (BACTROBAN) 2 % Apply 1 application topically 3 (three) times daily. 22 g 0   No current facility-administered medications for this visit.     OBJECTIVE:  Vitals:   07/30/19 1120  BP: (!) 148/72  Pulse: 65  Resp: 16  Temp: 98.2 F (36.8 C)  SpO2: 100%     Body mass index is 30.19 kg/m.   Wt Readings from Last 3 Encounters:  07/30/19 159 lb 12.8 oz (72.5 kg)  07/18/19 157 lb 9.6 oz (71.5 kg)  06/12/19 155 lb 9.6 oz (70.6 kg)      ECOG FS:0 - Asymptomatic GENERAL: Patient is a well appearing female in no acute distress HEENT:  Sclerae anicteric.  Mask in place.  Neck is supple.  NODES:  No cervical, supraclavicular, or axillary lymphadenopathy palpated.  BREAST EXAM:  Deferred. LUNGS:  Clear to auscultation bilaterally.  No wheezes or rhonchi. HEART:  Regular rate and rhythm. No murmur appreciated. ABDOMEN:  Soft, nontender.  Positive, normoactive bowel sounds. No organomegaly palpated. MSK:  No focal spinal tenderness to palpation. Full range of motion bilaterally in the upper extremities. EXTREMITIES:  No peripheral edema.   SKIN:  Clear with no obvious rashes or skin changes. No nail dyscrasia. NEURO:  Nonfocal. Well oriented.  Appropriate affect.    LAB RESULTS:  CMP     Component Value Date/Time   NA 139 07/30/2019 1043   NA 139 07/18/2019 0926   K 4.1 07/30/2019 1043   CL 104 07/30/2019 1043   CO2 24 07/30/2019 1043   GLUCOSE 94 07/30/2019 1043   BUN 8 07/30/2019 1043   BUN 8 07/18/2019 0926   CREATININE 0.67 07/30/2019 1043   CREATININE 0.58 12/19/2016 1304   CALCIUM 9.0 07/30/2019 1043   PROT 7.5 07/30/2019 1043   PROT 7.6 07/18/2019 0926   ALBUMIN 3.4 (L) 07/30/2019 1043   ALBUMIN 4.1 07/18/2019 0926   AST 10 (L) 07/30/2019 1043   ALT 13 07/30/2019 1043   ALKPHOS 72 07/30/2019  1043   BILITOT 0.7 07/30/2019 1043   GFRNONAA >60 07/30/2019 1043   GFRAA >60 07/30/2019 1043    No results found for: TOTALPROTELP, ALBUMINELP, A1GS, A2GS, BETS, BETA2SER, GAMS, MSPIKE, SPEI  No results found for: KPAFRELGTCHN, LAMBDASER, KAPLAMBRATIO  Lab Results  Component Value Date   WBC 10.4 07/30/2019   NEUTROABS 7.2 07/30/2019  HGB 12.4 07/30/2019   HCT 38.9 07/30/2019   MCV 79.6 (L) 07/30/2019   PLT 496 (H) 07/30/2019      Chemistry      Component Value Date/Time   NA 139 07/30/2019 1043   NA 139 07/18/2019 0926   K 4.1 07/30/2019 1043   CL 104 07/30/2019 1043   CO2 24 07/30/2019 1043   BUN 8 07/30/2019 1043   BUN 8 07/18/2019 0926   CREATININE 0.67 07/30/2019 1043   CREATININE 0.58 12/19/2016 1304      Component Value Date/Time   CALCIUM 9.0 07/30/2019 1043   ALKPHOS 72 07/30/2019 1043   AST 10 (L) 07/30/2019 1043   ALT 13 07/30/2019 1043   BILITOT 0.7 07/30/2019 1043       No results found for: LABCA2  No components found for: GXQJJH417  No results for input(s): INR in the last 168 hours.  No results found for: LABCA2  No results found for: EYC144  No results found for: YJE563  No results found for: JSH702  No results found for: CA2729  No components found for: HGQUANT  No results found for: CEA1 / No results found for: CEA1   No results found for: AFPTUMOR  No results found for: CHROMOGRNA  No results found for: PSA1  Appointment on 07/30/2019  Component Date Value Ref Range Status  . WBC Count 07/30/2019 10.4  4.0 - 10.5 K/uL Final  . RBC 07/30/2019 4.89  3.87 - 5.11 MIL/uL Final  . Hemoglobin 07/30/2019 12.4  12.0 - 15.0 g/dL Final  . HCT 07/30/2019 38.9  36.0 - 46.0 % Final  . MCV 07/30/2019 79.6* 80.0 - 100.0 fL Final  . MCH 07/30/2019 25.4* 26.0 - 34.0 pg Final  . MCHC 07/30/2019 31.9  30.0 - 36.0 g/dL Final  . RDW 07/30/2019 18.6* 11.5 - 15.5 % Final  . Platelet Count 07/30/2019 496* 150 - 400 K/uL Final  . nRBC  07/30/2019 0.0  0.0 - 0.2 % Final  . Neutrophils Relative % 07/30/2019 69  % Final  . Neutro Abs 07/30/2019 7.2  1.7 - 7.7 K/uL Final  . Lymphocytes Relative 07/30/2019 22  % Final  . Lymphs Abs 07/30/2019 2.3  0.7 - 4.0 K/uL Final  . Monocytes Relative 07/30/2019 6  % Final  . Monocytes Absolute 07/30/2019 0.6  0.1 - 1.0 K/uL Final  . Eosinophils Relative 07/30/2019 2  % Final  . Eosinophils Absolute 07/30/2019 0.2  0.0 - 0.5 K/uL Final  . Basophils Relative 07/30/2019 1  % Final  . Basophils Absolute 07/30/2019 0.1  0.0 - 0.1 K/uL Final  . Immature Granulocytes 07/30/2019 0  % Final  . Abs Immature Granulocytes 07/30/2019 0.02  0.00 - 0.07 K/uL Final   Performed at [facility], [place] [address], [place], [place] [address]  . Sodium 07/30/2019 139  135 - 145 mmol/L Final  . Potassium 07/30/2019 4.1  3.5 - 5.1 mmol/L Final  . Chloride 07/30/2019 104  98 - 111 mmol/L Final  . CO2 07/30/2019 24  22 - 32 mmol/L Final  . Glucose, Bld 07/30/2019 94  70 - 99 mg/dL Final  . BUN 07/30/2019 8  6 - 20 mg/dL Final  . Creatinine 07/30/2019 0.67  0.44 - 1.00 mg/dL Final  . Calcium 07/30/2019 9.0  8.9 - 10.3 mg/dL Final  . Total Protein 07/30/2019 7.5  6.5 - 8.1 g/dL Final  . Albumin 07/30/2019 3.4* 3.5 - 5.0 g/dL Final  . AST  07/30/2019 10* 15 - 41 U/L Final  . ALT 07/30/2019 13  0 - 44 U/L Final  . Alkaline Phosphatase 07/30/2019 72  38 - 126 U/L Final  . Total Bilirubin 07/30/2019 0.7  0.3 - 1.2 mg/dL Final  . GFR, Est Non Af Am 07/30/2019 >60  >60 mL/min Final  . GFR, Est AFR Am 07/30/2019 >60  >60 mL/min Final  . Anion gap 07/30/2019 11  5 - 15 Final   Performed at [facility], [place] [address], [place], [place] [address]  . Smear Review 07/30/2019 SMEAR STAINED AND AVAILABLE FOR REVIEW   Final   Performed at [facility], [address] [place], [place] [address]    (this displays the last labs from the last 3 days)   No results found for: TOTALPROTELP, ALBUMINELP, A1GS, A2GS, BETS, BETA2SER, GAMS, MSPIKE, SPEI (this displays SPEP labs)  No results found for: KPAFRELGTCHN, LAMBDASER, KAPLAMBRATIO (kappa/lambda light chains)  No results found for: HGBA, HGBA2QUANT, HGBFQUANT, HGBSQUAN (Hemoglobinopathy evaluation)   No results found for: LDH  Lab Results  Component Value Date   IRON 55 07/18/2019   TIBC 309 07/18/2019   IRONPCTSAT 18 07/18/2019   (Iron and TIBC)  Lab Results  Component Value Date   FERRITIN 18 07/18/2019    Urinalysis    Component Value Date/Time   BILIRUBINUR n 05/11/2015 1155   PROTEINUR n 05/11/2015 1155   UROBILINOGEN negative 05/11/2015 1155   NITRITE n 05/11/2015 1155   LEUKOCYTESUR Negative 05/11/2015 1155     STUDIES: No results found.  ASSESSMENT: 43 y.o. woman with iron deficiency due to menorrhagia, and reactive thrombocytosis  (1) Referred to [place] 2 weeks ago after was noted to have increased plt count in 05/2019 and 07/2019 and iron deficiency.  (2) Started oral iron in 05/2019  (3) To receive IV iron on 12/1 and 12/8  (4) referral placed to gynecology for evaluation of menorrahagia  PLAN:  April is here for evaluation of her thrombocytosis.  She met with myself and Dr. Jana Hakim today.  Her thrombocytosis is likely reactive due to her iron deficiency.  This is caused by her menorrhagia.  She was recommended to continue oral iron for several months and her iron slowly improve, or to receive IV iron and the iron improve quickly.  Risks and benefits of each were reviewed and she opted to receive IV iron.  She wants to return in December to receive this.  Once her iron corrects itself, the platelets should normalize as they will no longer be trying to "fix" the iron loss.    April understands this.  She does know that she will continue to have a need for iron as long as her menses is as heavy as they have been.  She is going to go and see gynecology, and  I placed that referral for her today.    April will receive IV iron.  We will see her back on 12/1 and again on 12/8 for feraheme.  She will undergo labs and f/u in about 10-12 weeks.  She was recommended to continue with the appropriate pandemic precautions. She knows to call for any questions that may arise between now and her next appointment.  We are happy to see her sooner if needed.   Wilber Bihari, NP   07/30/2019 11:47 AM Medical Oncology and Hematology [facility] [address] [place], [place] [address] Tel. [phone]    Fax. [phone]  ADDENDUM: I met with [place] and we reviewed her situation in detail.  She understands that any iron we can just generally stays in the body because there is no normal mechanism of excretion.  Bleeding however results in iron loss because red cells carry iron within them as part of her hemoglobin.  Her ferritin count has been under 20 and her MCV is low, which is consistent with iron deficiency.  The body response to iron deficiency by increasing the platelet count, a sensible response since the implication is that it may be bleeding somewhere and platelets of course are clotting cells  I think she will benefit from intravenous iron.  We discussed the possible toxicities side effects and complications of these infusions.  She is agreeable to receiving the usual 2 infusions separated by 1 week.  She will continue oral iron as well.  Anticipate that her MCV will rise and her platelet count drop within a few weeks of her treatment.  We will see her at that time and if that is not the case then she would need additional work-up at that point.  I personally saw this patient and performed a substantive portion of this encounter with the listed APP documented above.   Chauncey Cruel, MD Medical Oncology and Hematology [facility] [address] [place], [place] [address] Tel. [phone]    Fax. [phone]

## 2019-07-30 ENCOUNTER — Other Ambulatory Visit: Payer: Self-pay

## 2019-07-30 ENCOUNTER — Inpatient Hospital Stay: Payer: 59 | Attending: Adult Health | Admitting: Adult Health

## 2019-07-30 ENCOUNTER — Ambulatory Visit: Payer: Managed Care, Other (non HMO)

## 2019-07-30 ENCOUNTER — Inpatient Hospital Stay: Payer: 59

## 2019-07-30 ENCOUNTER — Encounter: Payer: Self-pay | Admitting: Adult Health

## 2019-07-30 VITALS — BP 148/72 | HR 65 | Temp 98.2°F | Resp 16 | Ht 61.0 in | Wt 159.8 lb

## 2019-07-30 DIAGNOSIS — D473 Essential (hemorrhagic) thrombocythemia: Secondary | ICD-10-CM | POA: Insufficient documentation

## 2019-07-30 DIAGNOSIS — E611 Iron deficiency: Secondary | ICD-10-CM | POA: Insufficient documentation

## 2019-07-30 DIAGNOSIS — D75839 Thrombocytosis, unspecified: Secondary | ICD-10-CM

## 2019-07-30 DIAGNOSIS — N920 Excessive and frequent menstruation with regular cycle: Secondary | ICD-10-CM | POA: Diagnosis not present

## 2019-07-30 DIAGNOSIS — D500 Iron deficiency anemia secondary to blood loss (chronic): Secondary | ICD-10-CM

## 2019-07-30 LAB — CMP (CANCER CENTER ONLY)
ALT: 13 U/L (ref 0–44)
AST: 10 U/L — ABNORMAL LOW (ref 15–41)
Albumin: 3.4 g/dL — ABNORMAL LOW (ref 3.5–5.0)
Alkaline Phosphatase: 72 U/L (ref 38–126)
Anion gap: 11 (ref 5–15)
BUN: 8 mg/dL (ref 6–20)
CO2: 24 mmol/L (ref 22–32)
Calcium: 9 mg/dL (ref 8.9–10.3)
Chloride: 104 mmol/L (ref 98–111)
Creatinine: 0.67 mg/dL (ref 0.44–1.00)
GFR, Est AFR Am: >60 mL/min (ref >60)
GFR, Estimated: >60 mL/min (ref >60)
Glucose, Bld: 94 mg/dL (ref 70–99)
Potassium: 4.1 mmol/L (ref 3.5–5.1)
Sodium: 139 mmol/L (ref 135–145)
Total Bilirubin: 0.7 mg/dL (ref 0.3–1.2)
Total Protein: 7.5 g/dL (ref 6.5–8.1)

## 2019-07-30 LAB — CBC WITH DIFFERENTIAL (CANCER CENTER ONLY)
Abs Immature Granulocytes: 0.02 10*3/uL (ref 0.00–0.07)
Basophils Absolute: 0.1 10*3/uL (ref 0.0–0.1)
Basophils Relative: 1 %
Eosinophils Absolute: 0.2 10*3/uL (ref 0.0–0.5)
Eosinophils Relative: 2 %
HCT: 38.9 % (ref 36.0–46.0)
Hemoglobin: 12.4 g/dL (ref 12.0–15.0)
Immature Granulocytes: 0 %
Lymphocytes Relative: 22 %
Lymphs Abs: 2.3 10*3/uL (ref 0.7–4.0)
MCH: 25.4 pg — ABNORMAL LOW (ref 26.0–34.0)
MCHC: 31.9 g/dL (ref 30.0–36.0)
MCV: 79.6 fL — ABNORMAL LOW (ref 80.0–100.0)
Monocytes Absolute: 0.6 10*3/uL (ref 0.1–1.0)
Monocytes Relative: 6 %
Neutro Abs: 7.2 10*3/uL (ref 1.7–7.7)
Neutrophils Relative %: 69 %
Platelet Count: 496 10*3/uL — ABNORMAL HIGH (ref 150–400)
RBC: 4.89 MIL/uL (ref 3.87–5.11)
RDW: 18.6 % — ABNORMAL HIGH (ref 11.5–15.5)
WBC Count: 10.4 10*3/uL (ref 4.0–10.5)
nRBC: 0 % (ref 0.0–0.2)

## 2019-07-30 LAB — SAVE SMEAR(SSMR), FOR PROVIDER SLIDE REVIEW

## 2019-07-30 LAB — SEDIMENTATION RATE: Sed Rate: 15 mm/hr (ref 0–22)

## 2019-07-31 ENCOUNTER — Telehealth: Payer: Self-pay | Admitting: Adult Health

## 2019-07-31 NOTE — Telephone Encounter (Signed)
I talk with patient regarding schedule  

## 2019-08-03 LAB — ANTINUCLEAR ANTIBODIES, IFA: ANA Ab, IFA: NEGATIVE

## 2019-08-12 ENCOUNTER — Other Ambulatory Visit: Payer: Self-pay | Admitting: Oncology

## 2019-08-12 ENCOUNTER — Telehealth: Payer: Self-pay | Admitting: Oncology

## 2019-08-12 NOTE — Telephone Encounter (Signed)
Added additional infusions for 12/15 and 12/22. Confirmed with patient.

## 2019-08-12 NOTE — Progress Notes (Signed)
Brandi Durham is insurance only is comfortable with venifer or ferrlecit.  We are going with venifer since it is only for treatment versus 8

## 2019-08-12 NOTE — Progress Notes (Signed)
Per Dr. Jana Hakim - okay to change regiment to Venofer per prior authorization/insurance recommendations.   RN left voicemail for return call to update. Scheduling message sent to add additional dates.

## 2019-08-13 ENCOUNTER — Inpatient Hospital Stay: Payer: 59 | Attending: Adult Health

## 2019-08-13 ENCOUNTER — Other Ambulatory Visit: Payer: Self-pay

## 2019-08-13 VITALS — BP 110/78 | HR 69 | Temp 98.3°F | Resp 16

## 2019-08-13 DIAGNOSIS — D75839 Thrombocytosis, unspecified: Secondary | ICD-10-CM

## 2019-08-13 DIAGNOSIS — E611 Iron deficiency: Secondary | ICD-10-CM | POA: Diagnosis present

## 2019-08-13 DIAGNOSIS — N920 Excessive and frequent menstruation with regular cycle: Secondary | ICD-10-CM | POA: Diagnosis not present

## 2019-08-13 DIAGNOSIS — D473 Essential (hemorrhagic) thrombocythemia: Secondary | ICD-10-CM

## 2019-08-13 DIAGNOSIS — D500 Iron deficiency anemia secondary to blood loss (chronic): Secondary | ICD-10-CM

## 2019-08-13 MED ORDER — SODIUM CHLORIDE 0.9 % IV SOLN
Freq: Once | INTRAVENOUS | Status: AC
  Administered 2019-08-13: 09:00:00 via INTRAVENOUS
  Filled 2019-08-13: qty 250

## 2019-08-13 MED ORDER — SODIUM CHLORIDE 0.9 % IV SOLN
200.0000 mg | Freq: Once | INTRAVENOUS | Status: AC
  Administered 2019-08-13: 200 mg via INTRAVENOUS
  Filled 2019-08-13: qty 10

## 2019-08-13 NOTE — Patient Instructions (Signed)

## 2019-08-20 ENCOUNTER — Other Ambulatory Visit: Payer: Self-pay

## 2019-08-20 ENCOUNTER — Inpatient Hospital Stay: Payer: 59

## 2019-08-20 VITALS — BP 101/62 | HR 72 | Temp 97.9°F | Resp 18

## 2019-08-20 DIAGNOSIS — D75839 Thrombocytosis, unspecified: Secondary | ICD-10-CM

## 2019-08-20 DIAGNOSIS — D473 Essential (hemorrhagic) thrombocythemia: Secondary | ICD-10-CM

## 2019-08-20 DIAGNOSIS — N920 Excessive and frequent menstruation with regular cycle: Secondary | ICD-10-CM | POA: Diagnosis not present

## 2019-08-20 DIAGNOSIS — D500 Iron deficiency anemia secondary to blood loss (chronic): Secondary | ICD-10-CM

## 2019-08-20 MED ORDER — SODIUM CHLORIDE 0.9 % IV SOLN
200.0000 mg | Freq: Once | INTRAVENOUS | Status: AC
  Administered 2019-08-20: 200 mg via INTRAVENOUS
  Filled 2019-08-20: qty 10

## 2019-08-20 MED ORDER — SODIUM CHLORIDE 0.9 % IV SOLN
INTRAVENOUS | Status: DC
  Administered 2019-08-20: 08:00:00 via INTRAVENOUS
  Filled 2019-08-20: qty 250

## 2019-08-20 NOTE — Patient Instructions (Signed)

## 2019-08-27 ENCOUNTER — Other Ambulatory Visit: Payer: Self-pay

## 2019-08-27 ENCOUNTER — Inpatient Hospital Stay: Payer: 59

## 2019-08-27 VITALS — BP 108/62 | HR 77 | Temp 98.3°F | Resp 16

## 2019-08-27 DIAGNOSIS — D500 Iron deficiency anemia secondary to blood loss (chronic): Secondary | ICD-10-CM

## 2019-08-27 DIAGNOSIS — D75839 Thrombocytosis, unspecified: Secondary | ICD-10-CM

## 2019-08-27 DIAGNOSIS — D473 Essential (hemorrhagic) thrombocythemia: Secondary | ICD-10-CM

## 2019-08-27 DIAGNOSIS — N920 Excessive and frequent menstruation with regular cycle: Secondary | ICD-10-CM | POA: Diagnosis not present

## 2019-08-27 MED ORDER — SODIUM CHLORIDE 0.9 % IV SOLN
Freq: Once | INTRAVENOUS | Status: AC
  Filled 2019-08-27: qty 250

## 2019-08-27 MED ORDER — SODIUM CHLORIDE 0.9 % IV SOLN
200.0000 mg | Freq: Once | INTRAVENOUS | Status: AC
  Administered 2019-08-27: 200 mg via INTRAVENOUS
  Filled 2019-08-27: qty 10

## 2019-08-27 NOTE — Patient Instructions (Signed)

## 2019-08-30 ENCOUNTER — Other Ambulatory Visit: Payer: Self-pay

## 2019-09-02 ENCOUNTER — Ambulatory Visit: Payer: 59 | Admitting: Women's Health

## 2019-09-02 ENCOUNTER — Encounter: Payer: Self-pay | Admitting: Women's Health

## 2019-09-02 ENCOUNTER — Other Ambulatory Visit: Payer: Self-pay

## 2019-09-02 VITALS — BP 124/78 | Ht 63.25 in | Wt 155.0 lb

## 2019-09-02 DIAGNOSIS — N920 Excessive and frequent menstruation with regular cycle: Secondary | ICD-10-CM | POA: Diagnosis not present

## 2019-09-02 MED ORDER — MEGESTROL ACETATE 40 MG PO TABS: ORAL_TABLET | ORAL | 0 refills | Status: DC

## 2019-09-02 NOTE — Progress Notes (Signed)
Brandi Durham Jul 26, 1976 [id number]    History:    Presents for problem visit had annual exam at primary care October 2020.  20-year history of menorrhagia cycles lasting 8 to 12 days 5-6 of those days extremely heavy flow changing protection every 30 minutes to an hour.  Long-term history of iron deficiency anemia currently receiving iron infusions weekly for the past month.  Numerous normal labs at primary care.  History of a ruptured appendix and ectopic.  BTL.  Same partner with negative STD screen 06/2019.  Reports normal Pap history Pap 06/2019 normal with negative high-risk HPV.  Has not had a screening mammogram, scheduled next month.  Past medical history, past surgical history, family history and social history were all reviewed and documented in the EPIC chart.  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  Twin daughters age 25 and 96 grandchildren.  ROS:  A ROS was performed and pertinent positives and negatives are included.  Exam:  Vitals:   09/02/19 1359  BP: 124/78  Weight: 155 lb (70.3 kg)  Height: 5' 3.25" (1.607 m)   Body mass index is 27.24 kg/m.   General appearance:  Normal Thyroid:  Symmetrical, normal in size, without palpable masses or nodularity. Respiratory  Auscultation:  Clear without wheezing or rhonchi Cardiovascular  Auscultation:  Regular rate, without rubs, murmurs or gallops  Edema/varicosities:  Not grossly evident Abdominal  Soft,nontender, without masses, guarding or rebound.  Liver/spleen:  No organomegaly noted  Hernia:  None appreciated  Skin  Inspection:  Grossly normal/numerous tattoos   Breasts: Not examined  Gentitourinary   Inguinal/mons:  Normal without inguinal adenopathy  External genitalia:  Normal  BUS/Urethra/Skene's glands:  Normal  Vagina:  Normal  Cervix:  Normal  Uterus: Nontender, normal in size, shape and contour.  Midline and mobile  Adnexa/parametria:     Rt: Without masses or tenderness.   Lt: Without masses or  tenderness.  Anus and perineum: Normal  Digital rectal exam: Normal sphincter tone without palpated masses or tenderness  Assessment/Plan:  43 y.o. S WF G2 P2 for problem visit for long-term menorrhagia causing iron deficiency anemia.  Schedule sonohysterogram with Dr. Dellis Filbert after next cycle so definitive plan can be made.  Plan: Megace 40 mg twice daily start day 5 of next cycle and continue until sonohysterogram.  Continue iron supplements and treatment as scheduled.  Possible treatments of ablation versus hysterectomy discussed.  SBEs, encouraged to keep scheduled mammogram appointment and encouraged annual screening.  Vitamin D 1000 daily encouraged.     Huel Cote [facility], 5:07 PM 09/02/2019

## 2019-09-02 NOTE — Patient Instructions (Addendum)
Menorrhagia  Menorrhagia is a condition in which menstrual periods are heavy or last longer than normal. With menorrhagia, most periods a woman has may cause enough blood loss and cramping that she becomes unable to take part in her usual activities. What are the causes? Common causes of this condition include:  Noncancerous growths in the uterus (polyps or fibroids).  An imbalance of the estrogen and progesterone hormones.  One of the ovaries not releasing an egg during one or more months.  A problem with the thyroid gland (hypothyroid).  Side effects of having an intrauterine device (IUD).  Side effects of some medicines, such as anti-inflammatory medicines or blood thinners.  A bleeding disorder that stops the blood from clotting normally. In some cases, the cause of this condition is not known. What are the signs or symptoms? Symptoms of this condition include:  Routinely having to change your pad or tampon every 1-2 hours because it is completely soaked.  Needing to use pads and tampons at the same time because of heavy bleeding.  Needing to wake up to change your pads or tampons during the night.  Passing blood clots larger than 1 inch (2.5 cm) in size.  Having bleeding that lasts for more than 7 days.  Having symptoms of low iron levels (anemia), such as tiredness, fatigue, or shortness of breath. How is this diagnosed? This condition may be diagnosed based on:  A physical exam.  Your symptoms and menstrual history.  Tests, such as: ? Blood tests to check if you are pregnant or have hormonal changes, a bleeding or thyroid disorder, anemia, or other problems. ? Pap test to check for cancerous changes, infections, or inflammation. ? Endometrial biopsy. This test involves removing a tissue sample from the lining of the uterus (endometrium) to be examined under a microscope. ? Pelvic ultrasound. This test uses sound waves to create images of your uterus, ovaries, and  vagina. The images can show if you have fibroids or other growths. ? Hysteroscopy. For this test, a small telescope is used to look inside your uterus. How is this treated? Treatment may not be needed for this condition. If it is needed, the best treatment for you will depend on:  Whether you need to prevent pregnancy.  Your desire to have children in the future.  The cause and severity of your bleeding.  Your personal preference. Medicines are the first step in treatment. You may be treated with:  Hormonal birth control methods. These treatments reduce bleeding during your menstrual period. They include: ? Birth control pills. ? Skin patch. ? Vaginal ring. ? Shots (injections) that you get every 3 months. ? Hormonal IUD (intrauterine device). ? Implants that go under the skin.  Medicines that thicken blood and slow bleeding.  Medicines that reduce swelling, such as ibuprofen.  Medicines that contain an artificial (synthetic) hormone called progestin.  Medicines that make the ovaries stop working for a short time.  Iron supplements to treat anemia. If medicines do not work, surgery may be done. Surgical options may include:  Dilation and curettage (D&C). In this procedure, your health care provider opens (dilates) your cervix and then scrapes or suctions tissue from the endometrium to reduce menstrual bleeding.  Operative hysteroscopy. In this procedure, a small tube with a light on the end (hysteroscope) is used to view your uterus and help remove polyps that may be causing heavy periods.  Endometrial ablation. This is when various techniques are used to permanently destroy your entire endometrium.  After endometrial ablation, most women have little or no menstrual flow. This procedure reduces your ability to become pregnant.  Endometrial resection. In this procedure, an electrosurgical wire loop is used to remove the endometrium. This procedure reduces your ability to become  pregnant.  Hysterectomy. This is surgical removal of the uterus. This is a permanent procedure that stops menstrual periods. Pregnancy is not possible after a hysterectomy. Follow these instructions at home: Medicines  Take over-the-counter and prescription medicines exactly as told by your health care provider. This includes iron pills.  Do not change or switch medicines without asking your health care provider.  Do not take aspirin or medicines that contain aspirin 1 week before or during your menstrual period. Aspirin may make bleeding worse. General instructions  If you need to change your sanitary pad or tampon more than once every 2 hours, limit your activity until the bleeding stops.  Iron pills can cause constipation. To prevent or treat constipation while you are taking prescription iron supplements, your health care provider may recommend that you: ? Drink enough fluid to keep your urine clear or pale yellow. ? Take over-the-counter or prescription medicines. ? Eat foods that are high in fiber, such as fresh fruits and vegetables, whole grains, and beans. ? Limit foods that are high in fat and processed sugars, such as fried and sweet foods.  Eat well-balanced meals, including foods that are high in iron. Foods that have a lot of iron include leafy green vegetables, meat, liver, eggs, and whole grain breads and cereals.  Do not try to lose weight until the abnormal bleeding has stopped and your blood iron level is back to normal. If you need to lose weight, work with your health care provider to lose weight safely.  Keep all follow-up visits as told by your health care provider. This is important. Contact a health care provider if:  You soak through a pad or tampon every 1 or 2 hours, and this happens every time you have a period.  You need to use pads and tampons at the same time because you are bleeding so much.  You have nausea, vomiting, diarrhea, or other problems  related to medicines you are taking. Get help right away if:  You soak through more than a pad or tampon in 1 hour.  You pass clots bigger than 1 inch (2.5 cm) wide.  You feel short of breath.  You feel like your heart is beating too fast.  You feel dizzy or faint.  You feel very weak or tired. Summary  Menorrhagia is a condition in which menstrual periods are heavy or last longer than normal.  Treatment will depend on the cause of the condition and may include medicines or procedures.  Take over-the-counter and prescription medicines exactly as told by your health care provider. This includes iron pills.  Get help right away if you have heavy bleeding that soaks through more than a pad or tampon in 1 hour, you are passing large clots, or you feel dizzy, faint or short of breath. This information is not intended to replace advice given to you by your health care provider. Make sure you discuss any questions you have with your health care provider. Document Released: 08/29/2005 Document Revised: 12/06/2017 Document Reviewed: 08/22/2016 Elsevier Patient Education  2020 [place] is a procedure to examine the inside of the uterus. This exam uses sound waves that are sent to a computer to make images of  the lining of the uterus (endometrium). To get the best images, a germ-free, salt-water solution (sterile saline) is put into the uterus through the vagina. You may have this procedure if you have certain reproductive problems, such as abnormal bleeding, infertility, or miscarriage. This procedure can show what may be causing these problems. Possible causes include scarring or abnormal growths such as fibroids inside your uterus. It can also show if your uterus is an abnormal shape or if the lining of the uterus is too thin. Tell a health care provider about:  All medicines you are taking, including vitamins, herbs, eye drops, creams, and  over-the-counter medicines.  Any allergies you have.  Any blood disorders you have.  Any surgeries you have had.  Any medical conditions you have.  Whether you are pregnant or may be pregnant.  The date of the first day of your last period.  Any signs of infection, such as fever, pain in your lower abdomen, or abnormal discharge from your vagina. What are the risks? Generally, this is a safe procedure. However, problems may occur, including:  Abdominal pain or cramping.  Light bleeding (spotting).  Increased vaginal discharge.  Infection. What happens before the procedure?  Your health care provider may have you take an over-the-counter pain medicine.  You may be given medicine to stop any abnormal bleeding.  You may be given antibiotic medicine to help prevent infection.  You may be asked to take a pregnancy test. This is usually in the form of a urine test.  You may have a pelvic exam.  You will be asked to empty your bladder. What happens during the procedure?  You will lie down on the exam table with your feet in stirrups or with your knees bent and your feet flat on the table.  A slender, handheld device (transducer) will be lubricated and placed into your vagina.  The transducer will be positioned to send sound waves to your uterus. The sound waves are sent to a computer and are turned into images, which your health care provider sees during the procedure.  The transducer will be removed from your vagina.  An instrument will be inserted to widen the opening of your vagina (speculum).  A swab with germ-killing solution (antiseptic) will be used to clean the opening to your uterus (cervix).  A long, thin tube (catheter) will be placed through your cervix into your uterus.  The speculum will be removed.  The transducer will be placed back into your vagina to take more images.  Your uterus will be filled with a germ-free, salt-water solution (sterile  saline) through the catheter. You may feel some cramping.  A fluid that contains air bubbles may be sent through the catheter to make it easier to see the fallopian tubes.  The transducer and catheter will be removed. The procedure may vary among health care providers and hospitals. What happens after the procedure?  It is up to you to get the results of your procedure. Ask your health care provider, or the department that is doing the procedure, when your results will be ready. Summary  A sonohysterogram is a procedure that creates images of the inside of the uterus.  The risks of this procedure are very low. Most women experience cramping and spotting after the procedure.  You may need to have a pelvic exam and take a pregnancy test before this procedure. This procedure will not be done if you are pregnant or have an infection. This information is  not intended to replace advice given to you by your health care provider. Make sure you discuss any questions you have with your health care provider. Document Released: 01/13/2014 Document Revised: 08/11/2017 Document Reviewed: 07/25/2016 Elsevier Patient Education  2020 [employer].

## 2019-09-03 ENCOUNTER — Other Ambulatory Visit: Payer: Self-pay

## 2019-09-03 ENCOUNTER — Other Ambulatory Visit: Payer: Self-pay | Admitting: *Deleted

## 2019-09-03 ENCOUNTER — Inpatient Hospital Stay: Payer: 59

## 2019-09-03 VITALS — BP 115/79 | HR 59 | Temp 98.7°F | Resp 16

## 2019-09-03 DIAGNOSIS — D75839 Thrombocytosis, unspecified: Secondary | ICD-10-CM

## 2019-09-03 DIAGNOSIS — N920 Excessive and frequent menstruation with regular cycle: Secondary | ICD-10-CM | POA: Diagnosis not present

## 2019-09-03 DIAGNOSIS — D473 Essential (hemorrhagic) thrombocythemia: Secondary | ICD-10-CM

## 2019-09-03 DIAGNOSIS — D500 Iron deficiency anemia secondary to blood loss (chronic): Secondary | ICD-10-CM

## 2019-09-03 MED ORDER — ACETAMINOPHEN 500 MG PO TABS
1000.0000 mg | ORAL_TABLET | Freq: Once | ORAL | Status: AC
  Administered 2019-09-03: 1000 mg via ORAL

## 2019-09-03 MED ORDER — ACETAMINOPHEN 500 MG PO TABS
ORAL_TABLET | ORAL | Status: AC
  Filled 2019-09-03: qty 2

## 2019-09-03 MED ORDER — SODIUM CHLORIDE 0.9 % IV SOLN
INTRAVENOUS | Status: DC
  Filled 2019-09-03: qty 250

## 2019-09-03 MED ORDER — SODIUM CHLORIDE 0.9 % IV SOLN
200.0000 mg | Freq: Once | INTRAVENOUS | Status: AC
  Administered 2019-09-03: 200 mg via INTRAVENOUS
  Filled 2019-09-03: qty 10

## 2019-09-03 NOTE — Patient Instructions (Signed)

## 2019-09-03 NOTE — Progress Notes (Signed)
Approximately 5 mins after infusion began, patient began c/o discomfort at IV site. Noted IV tubing pulling on IV site. IV tubing repositioned and secured with tape. Otherwise, IV site within normal limits. No evidence of infiltration. Infusion resumed and patient verbalized complete relief. Pump infusing with no evidence of increased resistance. Warm pack applied to site to help alleviate discomfort from cold infusion. Patient later began c/o discomfort at IV site, again. Infusion paused. Visual assessment revealed no evidence of infiltration. Tubing repositioned and secured with tape and infusion resumed. Pump infusing with no evidence of increased resistance. Infusion resumed and completed without further complaint. At time of IV removal, patient c/o IV site being tender to touch. No abnormal findings on visual assessment. Light compression bandage and cold pack applied for patient comfort. Patient continued to c/o discomfort after intervention. Stated, "It's throbbing now." Sandi Mealy PA-C notified and assessed patient in treatment area. Bandage removed for assessment. At that time, noted slightly ecchymotic and edematous area proximal to IV insertion site that was not present at time of IV removal. Patient stated, "It didn't look like that when you took it out. Now it's throbbing bad." Light compression bandage and cold pack reapplied and extremity elevated. Verbal orders received, repeated, and confirmed. Medicated per MAR. Patient verbalized minor relief with compression bandage and cold pack. Sandi Mealy, PA-C educated patient on use of compression bandage and cold therapy. Also, encouraged patient to keep extremity elevated as much as possible. He also encouraged patient to call our office if symptoms persist or worsen. Patient verbalized understanding and was discharged without further incident.

## 2019-09-04 ENCOUNTER — Telehealth: Payer: Self-pay

## 2019-09-04 NOTE — Telephone Encounter (Signed)
Called patient to follow-up on IV site pain and discomfort from 09/03/2019 treatment. No answer. Left voicemail for patient to call the [facility] if she is experiencing any problems.

## 2019-09-15 ENCOUNTER — Encounter (HOSPITAL_COMMUNITY): Payer: Self-pay | Admitting: Emergency Medicine

## 2019-09-15 ENCOUNTER — Other Ambulatory Visit: Payer: Self-pay | Admitting: Women's Health

## 2019-09-15 ENCOUNTER — Emergency Department (HOSPITAL_COMMUNITY)
Admission: EM | Admit: 2019-09-15 | Discharge: 2019-09-15 | Disposition: A | Discharge status: Home / Self Care | Payer: 59 | Attending: Emergency Medicine | Admitting: Emergency Medicine

## 2019-09-15 ENCOUNTER — Other Ambulatory Visit: Payer: Self-pay

## 2019-09-15 DIAGNOSIS — Z5321 Procedure and treatment not carried out due to patient leaving prior to being seen by health care provider: Secondary | ICD-10-CM | POA: Diagnosis not present

## 2019-09-15 DIAGNOSIS — N939 Abnormal uterine and vaginal bleeding, unspecified: Secondary | ICD-10-CM | POA: Diagnosis not present

## 2019-09-15 DIAGNOSIS — M545 Low back pain: Secondary | ICD-10-CM | POA: Insufficient documentation

## 2019-09-15 NOTE — ED Triage Notes (Signed)
Per pt, states she has been getting transfusions due to heavy menstrual cycles-states she started he period on Friday-having sever left lower back pain, crmping-going thru 4-5 super plus tampons a hour-suppose to started taking oral med 5 days after period starts

## 2019-09-16 ENCOUNTER — Telehealth: Payer: Self-pay | Admitting: *Deleted

## 2019-09-16 NOTE — Telephone Encounter (Signed)
Please call, review okay to start Megace today take twice daily until sonohysterogram which is scheduled January 14 with Dr. Dellis Filbert.  Reviewed should not be bleeding for test

## 2019-09-16 NOTE — Telephone Encounter (Signed)
Patient informed. I told her to call if refill needed on megace.

## 2019-09-16 NOTE — Telephone Encounter (Signed)
Patient called wanted to follow up went to ER on Saturday due to heavy bleeding was changing pads every 20 minutes and severe cramping/pain. Bleeding has slowed down to regular cycle flow changing pads every 1-2 hours, was prescribed megace 40 mg to take BID on day 5 of cycle, patient is now on day 3 of cycle.

## 2019-09-17 ENCOUNTER — Other Ambulatory Visit: Payer: Self-pay | Admitting: Family Medicine

## 2019-09-17 DIAGNOSIS — Z1231 Encounter for screening mammogram for malignant neoplasm of breast: Secondary | ICD-10-CM

## 2019-09-18 ENCOUNTER — Ambulatory Visit: Payer: 59 | Attending: Internal Medicine

## 2019-09-18 DIAGNOSIS — Z1231 Encounter for screening mammogram for malignant neoplasm of breast: Secondary | ICD-10-CM

## 2019-09-18 DIAGNOSIS — Z20822 Contact with and (suspected) exposure to covid-19: Secondary | ICD-10-CM

## 2019-09-19 LAB — NOVEL CORONAVIRUS, NAA: SARS-CoV-2, NAA: DETECTED — AB

## 2019-09-20 ENCOUNTER — Telehealth: Payer: Self-pay | Admitting: *Deleted

## 2019-09-20 NOTE — Telephone Encounter (Signed)
Has bleeding stopped?  If so could try micronor daily and if heavy bleeding occurs again start back on megace.  Remind can not be bleeding for sonohysterogram but may not be best to continue megace until rescheduled.  May need to take the megace again until [facility] scheduled.  Review the micrinor is a birth control pill with no placebo week

## 2019-09-20 NOTE — Telephone Encounter (Signed)
No patient is still bleeding, patient just started megace this week on 09/16/19.

## 2019-09-20 NOTE — Telephone Encounter (Signed)
Just FYI, patient called to report she tested positive for Covid, had to cancel [facility] appointment schedule on 10/09/19, patient asked if she should continue megace 40 mg tablet to help with bleeding. I told her she can continue Rx and follow up regarding bleeding.

## 2019-09-20 NOTE — Telephone Encounter (Signed)
Ok, continue the megace until [facility], and wish her well and hopefully not to many symptoms from covid

## 2019-09-26 ENCOUNTER — Ambulatory Visit: Payer: 59 | Admitting: Obstetrics & Gynecology

## 2019-09-26 ENCOUNTER — Other Ambulatory Visit: Payer: 59

## 2019-09-27 ENCOUNTER — Ambulatory Visit: Payer: 59 | Attending: Internal Medicine

## 2019-09-27 DIAGNOSIS — Z20822 Contact with and (suspected) exposure to covid-19: Secondary | ICD-10-CM

## 2019-09-28 LAB — NOVEL CORONAVIRUS, NAA: SARS-CoV-2, NAA: NOT DETECTED

## 2019-10-02 ENCOUNTER — Other Ambulatory Visit: Payer: Self-pay | Admitting: Family Medicine

## 2019-10-02 ENCOUNTER — Other Ambulatory Visit: Payer: Self-pay

## 2019-10-02 DIAGNOSIS — Z1231 Encounter for screening mammogram for malignant neoplasm of breast: Secondary | ICD-10-CM

## 2019-10-03 ENCOUNTER — Telehealth: Payer: Self-pay

## 2019-10-03 ENCOUNTER — Ambulatory Visit (INDEPENDENT_AMBULATORY_CARE_PROVIDER_SITE_OTHER): Payer: 59

## 2019-10-03 ENCOUNTER — Other Ambulatory Visit: Payer: Self-pay | Admitting: Obstetrics & Gynecology

## 2019-10-03 ENCOUNTER — Ambulatory Visit: Payer: 59 | Admitting: Obstetrics & Gynecology

## 2019-10-03 ENCOUNTER — Encounter: Payer: Self-pay | Admitting: Obstetrics & Gynecology

## 2019-10-03 DIAGNOSIS — D649 Anemia, unspecified: Secondary | ICD-10-CM | POA: Diagnosis not present

## 2019-10-03 DIAGNOSIS — N920 Excessive and frequent menstruation with regular cycle: Secondary | ICD-10-CM | POA: Diagnosis not present

## 2019-10-03 LAB — CBC
HCT: 40.9 % (ref 35.0–45.0)
Hemoglobin: 12.8 g/dL (ref 11.7–15.5)
MCH: 26.1 pg — ABNORMAL LOW (ref 27.0–33.0)
MCHC: 31.3 g/dL — ABNORMAL LOW (ref 32.0–36.0)
MCV: 83.5 fL (ref 80.0–100.0)
MPV: 9.8 fL (ref 7.5–12.5)
Platelets: 462 10*3/uL — ABNORMAL HIGH (ref 140–400)
RBC: 4.9 10*6/uL (ref 3.80–5.10)
RDW: 16.9 % — ABNORMAL HIGH (ref 11.0–15.0)
WBC: 8.9 10*3/uL (ref 3.8–10.8)

## 2019-10-03 NOTE — Telephone Encounter (Signed)
Called patient and discussed insurance benefits with her and her estimated surgery prepymt due by one week prior to surgery. Scheduled her for 3/15//21 at 7:30am at [facility].      I discussed the need for Covid testing pre-operatively and explained associated quarantine guidelines.  Appt scheduled.  I will mail her an info packet/financial letter.

## 2019-10-03 NOTE — Progress Notes (Signed)
    Brandi Durham [birth year] [id number]        44 y.o.  [id number] Married.  Twin C/S and Ectopic x 1.   RP: Menorrhagia for Pelvic US  HPI:  Worsening long standing menorrhagia.  Resistant to BCP and Nexplanon management.  IV transfusions x 4 in December 2020, followed by Dr Jana Hakim for severe anemia.  Took Megestrol from early January to now with decreased heavy flow, but persistent mild bleeding.  Severe dysmenorrhea.  No desire to preserve fertility.     OB History  Gravida Para Term Preterm AB Living  2 2       3   SAB TAB Ectopic Multiple Live Births        1      # Outcome Date GA Lbr Len/2nd Weight Sex Delivery Anes PTL Lv  2 Para           1 Para             Past medical history,surgical history, problem list, medications, allergies, family history and social history were all reviewed and documented in the EPIC chart.   Directed ROS with pertinent positives and negatives documented in the history of present illness/assessment and plan.  Exam:  There were no vitals filed for this visit. General appearance:  Normal  Pelvic US today: T/V images.  Anteverted uterus normal size and shape with no myometrial mass.  The uterus is measured at 7.77 x 4.99 x 4.26 cm.  The endometrial lining is symmetrical measured at 5.19 mm with no mass, thickening or abnormal flow seen.  Both ovaries are normal in size with normal follicular pattern.  A simple dominant follicle measuring 1.5 x 1.3 cm is present on the left ovary.  No adnexal mass seen.  No free fluid in the posterior cul-de-sac.   Assessment/Plan:  44 y.o. G2P2   1. Menorrhagia with regular cycle Longstanding worsening menorrhagia with severe dysmenorrhea causing severe secondary anemia.  Persisting with birth control pills and Nexplanon treatment in the past.  Patient has needed for IV iron transfusions in December 2020, followed by Dr. Jana Hakim hematologist.  No desire to preserve fertility.  We will check a CBC here today.   Counseling on management of severe menorrhagia and dysmenorrhea with all possible medical/hormonal treatments and conservative surgery with endometrial ablation discussed thoroughly.  Patient prefers to proceed with hysterectomy.  Given the history of C-section and ectopic, a robotic approach was chosen.  We will schedule XI Robotic TLH/Bilateral Salpingectomy.  Pamphlet and information given.  F/U Preop for further counseling on preop preparation, surgical procedure with risks and benefits and postop precautions.  Patient voiced understanding and agreement with plan. - CBC  2. Secondary anemia - CBC  Counseling on above issues and coordination of care more than 50% for 25 minutes.  [name] MD, 8:53 AM 10/03/2019

## 2019-10-03 NOTE — Patient Instructions (Signed)
1. Menorrhagia with regular cycle Longstanding worsening menorrhagia with severe dysmenorrhea causing severe secondary anemia.  Persisting with birth control pills and Nexplanon treatment in the past.  Patient has needed for IV iron transfusions in December 2020, followed by Dr. Jana Hakim hematologist.  No desire to preserve fertility.  We will check a CBC here today.  Counseling on management of severe menorrhagia and dysmenorrhea with all possible medical/hormonal treatments and conservative surgery with endometrial ablation discussed thoroughly.  Patient prefers to proceed with hysterectomy.  Given the history of C-section and ectopic, a robotic approach was chosen.  We will schedule XI Robotic TLH/Bilateral Salpingectomy.  Pamphlet and information given.  F/U Preop for further counseling on preop preparation, surgical procedure with risks and benefits and postop precautions.  Patient voiced understanding and agreement with plan. - CBC  2. Secondary anemia - CBC  April, it was a pleasure meeting you today!  I will inform you of your results as soon as they are available.

## 2019-10-04 ENCOUNTER — Ambulatory Visit
Admission: RE | Admit: 2019-10-04 | Discharge: 2019-10-04 | Disposition: A | Discharge status: Home / Self Care | Payer: 59 | Source: Ambulatory Visit

## 2019-10-04 ENCOUNTER — Other Ambulatory Visit: Payer: Self-pay

## 2019-10-04 DIAGNOSIS — Z1231 Encounter for screening mammogram for malignant neoplasm of breast: Secondary | ICD-10-CM

## 2019-10-07 ENCOUNTER — Other Ambulatory Visit: Payer: Self-pay

## 2019-10-07 ENCOUNTER — Other Ambulatory Visit: Payer: Self-pay | Admitting: Family Medicine

## 2019-10-07 DIAGNOSIS — D500 Iron deficiency anemia secondary to blood loss (chronic): Secondary | ICD-10-CM

## 2019-10-07 NOTE — Progress Notes (Signed)
[facility]  [phone]) [phone] [phone]) [phone]     ID: Brandi Durham DOB: [birth year]  MR#: [id number]  [id number]  Patient Care Team: Girtha Rm, NP-C as PCP - General Chauncey Cruel, MD OTHER MD:  CHIEF COMPLAINT: iron deficiency, and thrombocytosis  CURRENT TREATMENT: oral iron   INTERVAL HISTORY: Brandi Durham returns today for follow up of her iron deficiency and her thrombocytosis.  She received 4 Venofer treatments between 08/13/2019 and 09/03/2019.  She tolerated those well, with no side effects.  She continues on ferrous sulfate daily.  She was on this previously of course and it was not sufficient but the important point is that she does not have stomach problems constipation or any other side effects relating to that medication.  She has had a very nice response as far as her hemoglobin is concerned Results for Brandi Durham Brandi Durham "Brandi Durham" (MRN [id number]) as of 10/08/2019 11:21  Ref. Range 12/19/2016 13:04 05/17/2019 14:29 07/18/2019 09:26 07/30/2019 10:43 10/03/2019 09:10  Hemoglobin Latest Ref Range: 11.7 - 15.5 g/dL 11.4 (L) 11.2 11.5 12.4 12.8   Her platelets also have normalized, as she replete her iron, the number today being 385,000.  Brandi Durham was referred to gynecology on 09/02/2019. She was placed on megace 40 mg p.o. twice daily on 09/16/2019 and reports initially decreased flow but persistent mild bleeding.  She is still having spotting.  Pelvic ultrasound was performed on 10/03/2019 and showed: anteverted uterus, normal size and shape with no myometrial mass. She has opted to proceed with hysterectomy, which is scheduled for 11/25/2019 under Dr. Dellis Filbert.  Since her last visit, she underwent her first bilateral screening mammogram on 10/04/2019 at the breast center, showing breast density category C.  There was a possible mass in the left breast that warrants further evaluation and this is being set up  REVIEW OF SYSTEMS: Brandi Durham feels fine, and  really has no symptoms of any kind aside from the concern regarding continuing menstrual bleeding.  She is going back to work February 46.  She had the COVID-19 infection, which she got through a friend, quarantine herself for 2 weeks, and never had significant symptoms.  She felt like "the flu" for 2 days.  Aside from these issues a detailed review of systems today was stable   HISTORY OF CURRENT ILLNESS: From the original intake note:  Brandi Durham notes that she is here today because of elevated platelets and iron deficiency.  She was seen by a nurse practitioner and started on iron daily.  She underwent repeat lab testing and her plts were still elevated and she was referred to hematology.  The patient's subsequent history is as detailed below.   PAST MEDICAL HISTORY: Past Medical History:  Diagnosis Date  . Back problem   . History of iritis   . Thrombocytosis ([place]) 05/20/2019    PAST SURGICAL HISTORY: Past Surgical History:  Procedure Laterality Date  . ABDOMINAL HYSTERECTOMY    . ABDOMINAL SURGERY    . APPENDECTOMY    . BACK SURGERY    . CESAREAN SECTION    . KNEE SURGERY    . TUBAL LIGATION      FAMILY HISTORY Family History  Problem Relation Age of Onset  . Heart failure Father 50       Heart Failure  . Diabetes Mother   . Hypertension Mother   . Drug abuse Mother   . Heart attack Mother   . CAD Mother 41    GYNECOLOGIC HISTORY:  No LMP recorded. Menarche: 44 years old Age at first live birth: 44 years old GX P 1 (twins) LMP 11/5 (lasted until 11/13 or 11/14) Contraceptive, took years ago HRT no Hysterectomy? Scheduled for 11/25/2019 Salpingo-oophorectomy? 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  She has been unemployed since March of 2020.  She is scheduled to return to work 10/14/2019.  She keeps her grandchildren in her spare time.  She lives at home with her husband of 5 years Brandi Durham in [place], [place].  She has two  daughters and two granddaughters.  One is in [place], and the other in [place].  She drinks one day out of the weekend and will typically have 3 drinks.  Other nights of the week she may have one drink, or not drink at all.  She is a former smoker.  She quit about 3 weeks ago.  She denies any drug use.     ADVANCED DIRECTIVES: In the absence of any documents to the contrary the patient's husband is her healthcare power of attorney   HEALTH MAINTENANCE: Social History   Tobacco Use  . Smoking status: Former Smoker    Packs/day: 0.20    Years: 29.00    Pack years: 5.80    Types: Cigarettes    Quit date: 07/04/2019    Years since quitting: 0.2  . Smokeless tobacco: Never Used  Substance Use Topics  . Alcohol use: Yes    Comment: once per week   . Drug use: No     Colonoscopy: not yet  PAP: 2020  Bone density: 2018-normal   Allergies  Allergen Reactions  . Doxycycline     nausea  . Penicillins Swelling  . Sulfur Hives    Current Outpatient Medications  Medication Sig Dispense Refill  . ferrous sulfate 325 (65 FE) MG tablet TAKE 1 TABLET BY MOUTH EVERY DAY 90 tablet 1  . levocetirizine (XYZAL) 5 MG tablet TAKE 1 TABLET BY MOUTH EVERY DAY IN THE EVENING 30 tablet 1  . megestrol (MEGACE) 40 MG tablet Take twice daily, start on day 5 of cycle and take until sonohysterogram with dr Dellis Filbert 30 tablet 0   No current facility-administered medications for this visit.    OBJECTIVE:  There were no vitals filed for this visit.   There is no height or weight on file to calculate BMI.   Wt Readings from Last 3 Encounters:  09/02/19 155 lb (70.3 kg)  07/30/19 159 lb 12.8 oz (72.5 kg)  07/18/19 157 lb 9.6 oz (71.5 kg)      ECOG FS:0 - Asymptomatic  Sclerae unicteric, EOMs intact Wearing a mask No cervical or supraclavicular adenopathy Lungs no rales or rhonchi Heart regular rate and rhythm Abd soft, nontender, positive bowel sounds MSK no focal spinal tenderness, no  upper extremity lymphedema Neuro: nonfocal, well oriented, appropriate affect Breasts: The right breast is benign.  In the left breast there is an area in the upper outer quadrant which is slightly firmer, but certainly there is no well delineated mass.  Both axillae are benign.   LAB RESULTS:  CMP     Component Value Date/Time   NA 139 07/30/2019 1043   NA 139 07/18/2019 0926   K 4.1 07/30/2019 1043   CL 104 07/30/2019 1043   CO2 24 07/30/2019 1043   GLUCOSE 94 07/30/2019 1043   BUN 8 07/30/2019 1043   BUN 8 07/18/2019 0926   CREATININE 0.67 07/30/2019 1043  CREATININE 0.58 12/19/2016 1304   CALCIUM 9.0 07/30/2019 1043   PROT 7.5 07/30/2019 1043   PROT 7.6 07/18/2019 0926   ALBUMIN 3.4 (L) 07/30/2019 1043   ALBUMIN 4.1 07/18/2019 0926   AST 10 (L) 07/30/2019 1043   ALT 13 07/30/2019 1043   ALKPHOS 72 07/30/2019 1043   BILITOT 0.7 07/30/2019 1043   GFRNONAA >60 07/30/2019 1043   GFRAA >60 07/30/2019 1043    No results found for: TOTALPROTELP, ALBUMINELP, A1GS, A2GS, BETS, BETA2SER, GAMS, MSPIKE, SPEI  No results found for: Nils Pyle, [facility]  Lab Results  Component Value Date   WBC 8.9 10/03/2019   NEUTROABS 7.2 07/30/2019   HGB 12.8 10/03/2019   HCT 40.9 10/03/2019   MCV 83.5 10/03/2019   PLT 462 (H) 10/03/2019      Chemistry      Component Value Date/Time   NA 139 07/30/2019 1043   NA 139 07/18/2019 0926   K 4.1 07/30/2019 1043   CL 104 07/30/2019 1043   CO2 24 07/30/2019 1043   BUN 8 07/30/2019 1043   BUN 8 07/18/2019 0926   CREATININE 0.67 07/30/2019 1043   CREATININE 0.58 12/19/2016 1304      Component Value Date/Time   CALCIUM 9.0 07/30/2019 1043   ALKPHOS 72 07/30/2019 1043   AST 10 (L) 07/30/2019 1043   ALT 13 07/30/2019 1043   BILITOT 0.7 07/30/2019 1043       No results found for: LABCA2  No components found for: [id number]  No results for input(s): INR in the last 168 hours.  No results found for: LABCA2  No  results found for: [id number]  No results found for: [id number]  No results found for: [id number]  No results found for: CA2729  No components found for: HGQUANT  No results found for: CEA1 / No results found for: CEA1   No results found for: AFPTUMOR  No results found for: CHROMOGRNA  No results found for: HGBA, HGBA2QUANT, HGBFQUANT, HGBSQUAN (Hemoglobinopathy evaluation)   No results found for: LDH  Lab Results  Component Value Date   IRON 55 07/18/2019   TIBC 309 07/18/2019   IRONPCTSAT 18 07/18/2019   (Iron and TIBC)  Lab Results  Component Value Date   FERRITIN 18 07/18/2019    Urinalysis    Component Value Date/Time   BILIRUBINUR n 05/11/2015 1155   PROTEINUR n 05/11/2015 1155   UROBILINOGEN negative 05/11/2015 1155   NITRITE n 05/11/2015 1155   LEUKOCYTESUR Negative 05/11/2015 1155     STUDIES: US Transvaginal Non-OB  Result Date: 10/06/2019 T/V images.  Anteverted uterus normal size and shape with no myometrial mass.  The uterus is measured at 7.77 x 4.99 x 4.26 cm.  The endometrial lining is symmetrical measured at 5.19 mm with no mass, thickening or abnormal flow seen.  Both ovaries are normal in size with normal follicular pattern.  A simple dominant follicle measuring 1.5 x 1.3 cm is present on the left ovary.  No adnexal mass seen.  No free fluid in the posterior cul-de-sac.    MM 3D SCREEN BREAST BILATERAL  Result Date: 10/08/2019 CLINICAL DATA:  Screening. EXAM: DIGITAL SCREENING BILATERAL MAMMOGRAM WITH TOMO AND CAD COMPARISON:  No prior films ACR Breast Density Category c: The breast tissue is heterogeneously dense, which may obscure small masses. FINDINGS: In the left breast, a possible mass warrants further evaluation. In the right breast, no findings suspicious for malignancy. Images were processed with CAD. IMPRESSION: Further evaluation is suggested for  possible mass in the left breast. RECOMMENDATION: Ultrasound of the left breast. (Code:[id number]) The  patient will be contacted regarding the findings, and additional imaging will be scheduled. BI-RADS CATEGORY  0: Incomplete. Need additional imaging evaluation and/or prior mammograms for comparison. Electronically Signed   By: Abelardo Diesel M.D.   On: 10/08/2019 09:17    ASSESSMENT: 44 y.o. woman with iron deficiency due to menorrhagia, and reactive thrombocytosis  (1) Started oral iron in 05/2019  (2) status post Venofer x4 given between 08/13/2019 and 09/03/2019  (4) referral placed to gynecology for evaluation of menorrahagia  (a) currently on megestrol 40 mg p.o. twice daily, with mixed results  (b) hysterectomy with bilateral salpingo-oophorectomy planned for 11/25/2019   PLAN: Brandi Durham is no longer anemic and her thrombocytosis has resolved as we repleted her iron stores.  Unfortunately she continues to spot despite the megestrol.  She is continuing on oral iron but this was not sufficient previously and likely will not be over the next 3 months.  The iron deficiency problem will be solved once she stops bleeding and that will happen after her 11/25/2019 hysterectomy.  Of course she may be expected to have some bleeding during the surgery as well.  I am going to see her one more time in mid Brandi Durham and make sure she has not become iron deficient again by then.  If so she will receive some additional Venofer and at that point she will be released from follow-up.  The only fly in the ointment is that her mammogram did show a possible abnormality in the left breast.  Diagnostic mammography and ultrasonography and possible biopsy are pending.  Of course we will follow up on that as well if anything of concern shows up.  I discussed all that with her in detail  She knows to call for any other issue that may develop before the next visit  Total encounter time 25 minutes.Chauncey Cruel, MD Medical Oncology and Hematology [facility] [place], [place]  [address] Tel. [phone]    Fax. [phone]   I, Wilburn Mylar, am acting as scribe for Dr. Virgie Dad. Tessica Cupo.  I, Lurline Del MD, have reviewed the above documentation for accuracy and completeness, and I agree with the above.   *Total Encounter Time as defined by the Centers for Medicare and Medicaid Services includes, in addition to the face-to-face time of a patient visit (documented in the note above) non-face-to-face time: obtaining and reviewing outside history, ordering and reviewing medications, tests or procedures, care coordination (communications with other health care professionals or caregivers) and documentation in the medical record.

## 2019-10-08 ENCOUNTER — Inpatient Hospital Stay: Payer: 59 | Attending: Adult Health

## 2019-10-08 ENCOUNTER — Other Ambulatory Visit: Payer: Self-pay

## 2019-10-08 ENCOUNTER — Inpatient Hospital Stay: Payer: 59 | Admitting: Oncology

## 2019-10-08 VITALS — BP 104/62 | HR 81 | Temp 97.9°F | Resp 17 | Ht 63.25 in | Wt 158.5 lb

## 2019-10-08 DIAGNOSIS — D75839 Thrombocytosis, unspecified: Secondary | ICD-10-CM

## 2019-10-08 DIAGNOSIS — N920 Excessive and frequent menstruation with regular cycle: Secondary | ICD-10-CM

## 2019-10-08 DIAGNOSIS — D500 Iron deficiency anemia secondary to blood loss (chronic): Secondary | ICD-10-CM | POA: Diagnosis not present

## 2019-10-08 DIAGNOSIS — D473 Essential (hemorrhagic) thrombocythemia: Secondary | ICD-10-CM | POA: Diagnosis not present

## 2019-10-08 DIAGNOSIS — E611 Iron deficiency: Secondary | ICD-10-CM | POA: Diagnosis present

## 2019-10-08 LAB — CBC WITH DIFFERENTIAL (CANCER CENTER ONLY)
Abs Immature Granulocytes: 0.03 10*3/uL (ref 0.00–0.07)
Basophils Absolute: 0.1 10*3/uL (ref 0.0–0.1)
Basophils Relative: 1 %
Eosinophils Absolute: 0.1 10*3/uL (ref 0.0–0.5)
Eosinophils Relative: 1 %
HCT: 38.8 % (ref 36.0–46.0)
Hemoglobin: 12.6 g/dL (ref 12.0–15.0)
Immature Granulocytes: 0 %
Lymphocytes Relative: 18 %
Lymphs Abs: 1.8 10*3/uL (ref 0.7–4.0)
MCH: 26.2 pg (ref 26.0–34.0)
MCHC: 32.5 g/dL (ref 30.0–36.0)
MCV: 80.7 fL (ref 80.0–100.0)
Monocytes Absolute: 0.6 10*3/uL (ref 0.1–1.0)
Monocytes Relative: 6 %
Neutro Abs: 7.6 10*3/uL (ref 1.7–7.7)
Neutrophils Relative %: 74 %
Platelet Count: 385 10*3/uL (ref 150–400)
RBC: 4.81 MIL/uL (ref 3.87–5.11)
RDW: 18.7 % — ABNORMAL HIGH (ref 11.5–15.5)
WBC Count: 10.2 10*3/uL (ref 4.0–10.5)
nRBC: 0 % (ref 0.0–0.2)

## 2019-10-08 LAB — IRON AND TIBC
Iron: 47 ug/dL (ref 41–142)
Saturation Ratios: 19 % — ABNORMAL LOW (ref 21–57)
TIBC: 254 ug/dL (ref 236–444)
UIBC: 207 ug/dL (ref 120–384)

## 2019-10-08 LAB — FERRITIN: Ferritin: 126 ng/mL (ref 11–307)

## 2019-10-09 ENCOUNTER — Other Ambulatory Visit: Payer: Self-pay | Admitting: Family Medicine

## 2019-10-09 ENCOUNTER — Other Ambulatory Visit: Payer: 59

## 2019-10-09 ENCOUNTER — Telehealth: Payer: Self-pay | Admitting: Oncology

## 2019-10-09 ENCOUNTER — Ambulatory Visit: Payer: 59 | Admitting: Obstetrics & Gynecology

## 2019-10-09 DIAGNOSIS — R928 Other abnormal and inconclusive findings on diagnostic imaging of breast: Secondary | ICD-10-CM

## 2019-10-09 NOTE — Telephone Encounter (Signed)
Patient stated she looked at my chart for her schedule

## 2019-10-18 ENCOUNTER — Ambulatory Visit
Admission: RE | Admit: 2019-10-18 | Discharge: 2019-10-18 | Disposition: A | Discharge status: Home / Self Care | Payer: 59 | Source: Ambulatory Visit | Attending: Family Medicine | Admitting: Family Medicine

## 2019-10-18 ENCOUNTER — Other Ambulatory Visit: Payer: Self-pay

## 2019-10-18 DIAGNOSIS — R928 Other abnormal and inconclusive findings on diagnostic imaging of breast: Secondary | ICD-10-CM

## 2019-10-24 ENCOUNTER — Ambulatory Visit: Payer: 59 | Admitting: Obstetrics & Gynecology

## 2019-10-30 ENCOUNTER — Encounter: Payer: Self-pay | Admitting: Gynecology

## 2019-11-14 ENCOUNTER — Other Ambulatory Visit: Payer: Self-pay

## 2019-11-14 NOTE — Patient Instructions (Signed)
[facility] April Umbarger      Your procedure is scheduled on  11/25/19   Report to [place]  At  5:30  A.M.   Call this number if you have problems the morning of [phone]   OUR ADDRESS IS [address], WE ARE LOCATED IN THE [facility] WITH [facility].   Remember:  Do not eat food or drink liquids after midnight.  Take these medicines the morning of surgery with A SIP OF WATER: none   Do not wear jewelry, make-up or nail polish.  Do not wear lotions, powders, or perfumes, or deoderant.  Do not shave 48 hours prior to surgery.    Do not bring valuables to the hospital.  [facility] is not responsible for any belongings or valuables.  Contacts, dentures or bridgework may not be worn into surgery.  For patients admitted to the hospital, discharge time will be determined by your treatment team.  Patients discharged the day of surgery will not be allowed to drive home.   Special instructions:  Bring meds in original bottles  Please read over the following fact sheets that you were given:       [facility] - Preparing for Surgery  Before surgery, you can play an important role.   Because skin is not sterile, your skin needs to be as free of germs as possible .  You can reduce the number of germs on your skin by washing with CHG (chlorahexidine gluconate) soap before surgery.   CHG is an antiseptic cleaner which kills germs and bonds with the skin to continue killing germs even after washing. Please DO NOT use if you have an allergy to CHG or antibacterial soaps  If your skin becomes reddened/irritated stop using the CHG and inform your nurse when you arrive at Short Stay. Do not shave (including legs and underarms) for at least 48 hours prior to the first CHG shower.    Please follow these instructions carefully:  1.  Shower with CHG Soap the night before surgery and the  morning of Surgery.  2.  If you choose to wash your hair,  wash your hair first as usual with your  normal  shampoo.  3.  After you shampoo, rinse your hair and body thoroughly to remove the  shampoo.                                        4.  Use CHG as you would any other liquid soap.  You can apply chg directly  to the skin and wash                       Gently with a scrungie or clean washcloth.  5.  Apply the CHG Soap to your body ONLY FROM THE NECK DOWN.   Do not use on face/ open                           Wound or open sores. Avoid contact with eyes, ears mouth and genitals (private parts).                       Wash face,  Genitals (private parts) with your normal soap.  6.  Wash thoroughly, paying special attention to the area where your surgery  will be performed.  7.  Thoroughly rinse your body with warm water from the neck down.  8.  DO NOT shower/wash with your normal soap after using and rinsing off  the CHG Soap.             9.  Pat yourself dry with a clean towel.            10.  Wear clean pajamas.            11.  Place clean sheets on your bed the night of your first shower and do not  sleep with pets. Day of Surgery : Do not apply any lotions/deodorants the morning of surgery.  Please wear clean clothes to the hospital/surgery center.  FAILURE TO FOLLOW THESE INSTRUCTIONS MAY RESULT IN THE CANCELLATION OF YOUR SURGERY PATIENT SIGNATURE_________________________________  NURSE SIGNATURE__________________________________  ________________________________________________________________________

## 2019-11-15 ENCOUNTER — Encounter (HOSPITAL_COMMUNITY)
Admission: RE | Admit: 2019-11-15 | Discharge: 2019-11-15 | Disposition: A | Discharge status: Home / Self Care | Payer: 59 | Source: Ambulatory Visit | Attending: Obstetrics & Gynecology | Admitting: Obstetrics & Gynecology

## 2019-11-15 ENCOUNTER — Encounter (HOSPITAL_COMMUNITY): Payer: Self-pay

## 2019-11-15 ENCOUNTER — Other Ambulatory Visit: Payer: Self-pay

## 2019-11-15 ENCOUNTER — Telehealth: Payer: 59 | Admitting: Obstetrics & Gynecology

## 2019-11-15 ENCOUNTER — Encounter: Payer: Self-pay | Admitting: Obstetrics & Gynecology

## 2019-11-15 DIAGNOSIS — Z01812 Encounter for preprocedural laboratory examination: Secondary | ICD-10-CM | POA: Diagnosis not present

## 2019-11-15 DIAGNOSIS — D500 Iron deficiency anemia secondary to blood loss (chronic): Secondary | ICD-10-CM | POA: Diagnosis not present

## 2019-11-15 DIAGNOSIS — N920 Excessive and frequent menstruation with regular cycle: Secondary | ICD-10-CM | POA: Diagnosis not present

## 2019-11-15 DIAGNOSIS — Z87891 Personal history of nicotine dependence: Secondary | ICD-10-CM | POA: Diagnosis not present

## 2019-11-15 DIAGNOSIS — D649 Anemia, unspecified: Secondary | ICD-10-CM | POA: Diagnosis not present

## 2019-11-15 LAB — CBC
HCT: 38.7 % (ref 36.0–46.0)
Hemoglobin: 12.5 g/dL (ref 12.0–15.0)
MCH: 27.7 pg (ref 26.0–34.0)
MCHC: 32.3 g/dL (ref 30.0–36.0)
MCV: 85.6 fL (ref 80.0–100.0)
Platelets: 400 10*3/uL (ref 150–400)
RBC: 4.52 MIL/uL (ref 3.87–5.11)
RDW: 16.5 % — ABNORMAL HIGH (ref 11.5–15.5)
WBC: 10.5 10*3/uL (ref 4.0–10.5)
nRBC: 0 % (ref 0.0–0.2)

## 2019-11-15 NOTE — Progress Notes (Addendum)
    Brandi Durham 1976-08-17 [id number]        44 y.o.  [id number] Married.  Twin C/S and Ectopic x 1.   Video visit for preop counseling.  Verbal consent obtained and patient well identified.  Counseling for 25 minutes with patient in her home and myself in my [place] office.  RP: Refractory Menorrhagia/Pelvic Pain/Anemia for Preop Robotic TLH/Bilateral Salpingectomy  HPI:  No change x visit 10/03/2019:  Worsening long standing menorrhagia.  Resistant to BCP and Nexplanon management.  IV transfusions x 4 in December 2020, followed by Dr Jana Hakim for severe anemia.  Took Megestrol from early January to now with decreased heavy flow, but persistent mild bleeding.  Severe dysmenorrhea.  No desire to preserve fertility.     OB History  Gravida Para Term Preterm AB Living  2 1     1 2   SAB TAB Ectopic Multiple Live Births      1 1      # Outcome Date GA Lbr Len/2nd Weight Sex Delivery Anes PTL Lv  2 Ectopic           1 Para             Past medical history,surgical history, problem list, medications, allergies, family history and social history were all reviewed and documented in the EPIC chart.   Directed ROS with pertinent positives and negatives documented in the history of present illness/assessment and plan.  Exam:  There were no vitals filed for this visit. General appearance:  Normal  Televisit  Pelvic US 10/03/2019: T/V images. Anteverted uterus normal size and shape with no myometrial mass. The uterus is measured at 7.77 x 4.99 x 4.26 cm. The endometrial lining is symmetrical measured at 5.19 mm with no mass, thickening or abnormal flow seen. Both ovaries are normal in size with normal follicular pattern. A simple dominant follicle measuring 1.5 x 1.3 cm is present on the left ovary. No adnexal mass seen. No free fluid in the posterior cul-de-sac.  Hb 12.6 on 10/08/2019   Assessment/Plan:  44 y.o. G2P0012   1. Menorrhagia with regular cycle Severe refractory  menorrhagia resistant to birth control pills and Nexplanon.  Had to receive for iron transfusion in 2020.  Pelvic ultrasound showing a normal size uterus with a normal endometrial lining at 5.19 mm.  Patient had 2 C-sections and an ectopic pregnancy.  Therefore the decision was made to use a robotic approach.  Will proceed with an XI robotic total laparoscopic hysterectomy with bilateral salpingectomy on November 25, 2019.  Preop preparation, surgery with risks and postop precautions reviewed.  Patient agrees with plan and does not have further questions.  2. Secondary anemia Last Hb on Megace normal at 12.6.                        Patient was counseled as to the risk of surgery to include the following:  1. Infection (prohylactic antibiotics will be administered)  2. DVT/Pulmonary Embolism (prophylactic pneumo compression stockings will be used)  3.Trauma to internal organs requiring additional surgical procedure to repair any injury to internal organs requiring perhaps additional hospitalization days.  4.Hemmorhage requiring transfusion and blood products which carry risks such as anaphylactic reaction, hepatitis and AIDS  Patient had received literature information on the procedure scheduled and all her questions were answered and fully accepts all risk.   [name] MD, 3:02 PM 11/15/2019

## 2019-11-16 LAB — ABO/RH: ABO/RH(D): A POS

## 2019-11-21 ENCOUNTER — Other Ambulatory Visit (HOSPITAL_COMMUNITY): Payer: 59

## 2019-11-23 NOTE — Anesthesia Preprocedure Evaluation (Addendum)
Anesthesia Evaluation  Patient identified by MRN, date of birth, ID band Patient awake    Reviewed: Allergy & Precautions, NPO status , Patient's Chart, lab work & pertinent test results  History of Anesthesia Complications Negative for: history of anesthetic complications  Airway Mallampati: II  TM Distance: >3 FB Neck ROM: Full    Dental no notable dental hx.    Pulmonary former smoker,    Pulmonary exam normal        Cardiovascular negative cardio ROS Normal cardiovascular exam     Neuro/Psych negative neurological ROS  negative psych ROS   GI/Hepatic negative GI ROS, Neg liver ROS,   Endo/Other  negative endocrine ROS  Renal/GU negative Renal ROS  negative genitourinary   Musculoskeletal negative musculoskeletal ROS (+)   Abdominal   Peds  Hematology negative hematology ROS (+)   Anesthesia Other Findings Day of surgery medications reviewed with patient.  Reproductive/Obstetrics menorrhagia                            Anesthesia Physical Anesthesia Plan  ASA: II  Anesthesia Plan: General   Post-op Pain Management:    Induction: Intravenous  PONV Risk Score and Plan: 4 or greater and Treatment may vary due to age or medical condition, Ondansetron, Dexamethasone, Midazolam and Scopolamine patch - Pre-op  Airway Management Planned: Oral ETT  Additional Equipment: None  Intra-op Plan:   Post-operative Plan: Extubation in OR  Informed Consent: I have reviewed the patients History and Physical, chart, labs and discussed the procedure including the risks, benefits and alternatives for the proposed anesthesia with the patient or authorized representative who has indicated his/her understanding and acceptance.     Dental advisory given  Plan Discussed with: CRNA  Anesthesia Plan Comments:        Anesthesia Quick Evaluation

## 2019-11-25 ENCOUNTER — Other Ambulatory Visit: Payer: Self-pay

## 2019-11-25 ENCOUNTER — Ambulatory Visit (HOSPITAL_BASED_OUTPATIENT_CLINIC_OR_DEPARTMENT_OTHER): Payer: 59 | Admitting: Anesthesiology

## 2019-11-25 ENCOUNTER — Encounter (HOSPITAL_BASED_OUTPATIENT_CLINIC_OR_DEPARTMENT_OTHER)
Admission: RE | Disposition: A | Discharge status: Home / Self Care | Payer: Self-pay | Source: Other Acute Inpatient Hospital | Attending: Obstetrics & Gynecology

## 2019-11-25 ENCOUNTER — Ambulatory Visit (HOSPITAL_BASED_OUTPATIENT_CLINIC_OR_DEPARTMENT_OTHER)
Admission: RE | Admit: 2019-11-25 | Discharge: 2019-11-26 | Disposition: A | Discharge status: Home / Self Care | Payer: 59 | Source: Other Acute Inpatient Hospital | Attending: Obstetrics & Gynecology | Admitting: Obstetrics & Gynecology

## 2019-11-25 ENCOUNTER — Encounter (HOSPITAL_BASED_OUTPATIENT_CLINIC_OR_DEPARTMENT_OTHER): Payer: Self-pay | Admitting: Obstetrics & Gynecology

## 2019-11-25 DIAGNOSIS — N72 Inflammatory disease of cervix uteri: Secondary | ICD-10-CM | POA: Insufficient documentation

## 2019-11-25 DIAGNOSIS — N800 Endometriosis of uterus: Secondary | ICD-10-CM

## 2019-11-25 DIAGNOSIS — D649 Anemia, unspecified: Secondary | ICD-10-CM

## 2019-11-25 DIAGNOSIS — N888 Other specified noninflammatory disorders of cervix uteri: Secondary | ICD-10-CM

## 2019-11-25 DIAGNOSIS — N920 Excessive and frequent menstruation with regular cycle: Secondary | ICD-10-CM | POA: Diagnosis not present

## 2019-11-25 DIAGNOSIS — N946 Dysmenorrhea, unspecified: Secondary | ICD-10-CM | POA: Diagnosis not present

## 2019-11-25 DIAGNOSIS — Z881 Allergy status to other antibiotic agents status: Secondary | ICD-10-CM | POA: Insufficient documentation

## 2019-11-25 DIAGNOSIS — Z880 Allergy status to penicillin: Secondary | ICD-10-CM | POA: Diagnosis not present

## 2019-11-25 DIAGNOSIS — N736 Female pelvic peritoneal adhesions (postinfective): Secondary | ICD-10-CM

## 2019-11-25 DIAGNOSIS — F17211 Nicotine dependence, cigarettes, in remission: Secondary | ICD-10-CM | POA: Diagnosis not present

## 2019-11-25 DIAGNOSIS — D500 Iron deficiency anemia secondary to blood loss (chronic): Secondary | ICD-10-CM | POA: Diagnosis not present

## 2019-11-25 DIAGNOSIS — Z9889 Other specified postprocedural states: Secondary | ICD-10-CM

## 2019-11-25 HISTORY — PX: ROBOTIC ASSISTED TOTAL HYSTERECTOMY WITH BILATERAL SALPINGO OOPHERECTOMY: SHX6086

## 2019-11-25 LAB — TYPE AND SCREEN
ABO/RH(D): A POS
Antibody Screen: NEGATIVE

## 2019-11-25 LAB — POCT PREGNANCY, URINE: Preg Test, Ur: NEGATIVE

## 2019-11-25 SURGERY — HYSTERECTOMY, TOTAL, ROBOT-ASSISTED, LAPAROSCOPIC, WITH BILATERAL SALPINGO-OOPHORECTOMY
Anesthesia: General | Site: Abdomen | Laterality: Bilateral

## 2019-11-25 MED ORDER — PROMETHAZINE HCL 25 MG/ML IJ SOLN
6.2500 mg | INTRAMUSCULAR | Status: DC | PRN
  Filled 2019-11-25: qty 1

## 2019-11-25 MED ORDER — PHENYLEPHRINE HCL (PRESSORS) 10 MG/ML IV SOLN
INTRAVENOUS | Status: DC | PRN
  Administered 2019-11-25: 40 ug via INTRAVENOUS

## 2019-11-25 MED ORDER — BUPIVACAINE HCL (PF) 0.25 % IJ SOLN
INTRAMUSCULAR | Status: DC | PRN
  Administered 2019-11-25: 14 mL

## 2019-11-25 MED ORDER — OXYCODONE-ACETAMINOPHEN 5-325 MG PO TABS
2.0000 | ORAL_TABLET | ORAL | Status: DC | PRN
  Administered 2019-11-25 – 2019-11-26 (×2): 2 via ORAL
  Administered 2019-11-26: 1 via ORAL
  Filled 2019-11-25: qty 2

## 2019-11-25 MED ORDER — SCOPOLAMINE 1 MG/3DAYS TD PT72
MEDICATED_PATCH | TRANSDERMAL | Status: AC
  Filled 2019-11-25: qty 1

## 2019-11-25 MED ORDER — LIDOCAINE 2% (20 MG/ML) 5 ML SYRINGE
INTRAMUSCULAR | Status: AC
  Filled 2019-11-25: qty 5

## 2019-11-25 MED ORDER — FENTANYL CITRATE (PF) 100 MCG/2ML IJ SOLN
25.0000 ug | INTRAMUSCULAR | Status: DC | PRN
  Administered 2019-11-25: 50 ug via INTRAVENOUS
  Administered 2019-11-25 (×2): 25 ug via INTRAVENOUS
  Filled 2019-11-25: qty 1

## 2019-11-25 MED ORDER — FENTANYL CITRATE (PF) 100 MCG/2ML IJ SOLN
INTRAMUSCULAR | Status: AC
  Filled 2019-11-25: qty 2

## 2019-11-25 MED ORDER — LACTATED RINGERS IV SOLN
INTRAVENOUS | Status: DC
  Administered 2019-11-25: 1000 mL via INTRAVENOUS
  Filled 2019-11-25: qty 1000

## 2019-11-25 MED ORDER — HYDROCODONE-ACETAMINOPHEN 5-325 MG PO TABS
1.0000 | ORAL_TABLET | ORAL | Status: DC | PRN
  Administered 2019-11-25 – 2019-11-26 (×4): 1 via ORAL
  Filled 2019-11-25: qty 2

## 2019-11-25 MED ORDER — EPHEDRINE 5 MG/ML INJ
INTRAVENOUS | Status: AC
  Filled 2019-11-25: qty 10

## 2019-11-25 MED ORDER — MIDAZOLAM HCL 5 MG/5ML IJ SOLN
INTRAMUSCULAR | Status: DC | PRN
  Administered 2019-11-25: 2 mg via INTRAVENOUS

## 2019-11-25 MED ORDER — ACETAMINOPHEN 500 MG PO TABS
ORAL_TABLET | ORAL | Status: AC
  Filled 2019-11-25: qty 2

## 2019-11-25 MED ORDER — PROPOFOL 10 MG/ML IV BOLUS
INTRAVENOUS | Status: DC | PRN
  Administered 2019-11-25: 50 mg via INTRAVENOUS
  Administered 2019-11-25: 150 mg via INTRAVENOUS

## 2019-11-25 MED ORDER — OXYCODONE-ACETAMINOPHEN 5-325 MG PO TABS
ORAL_TABLET | ORAL | Status: AC
  Filled 2019-11-25: qty 2

## 2019-11-25 MED ORDER — HYDROCODONE-ACETAMINOPHEN 5-325 MG PO TABS
ORAL_TABLET | ORAL | Status: AC
  Filled 2019-11-25: qty 1

## 2019-11-25 MED ORDER — FENTANYL CITRATE (PF) 100 MCG/2ML IJ SOLN
INTRAMUSCULAR | Status: DC | PRN
  Administered 2019-11-25: 50 ug via INTRAVENOUS
  Administered 2019-11-25: 75 ug via INTRAVENOUS
  Administered 2019-11-25: 25 ug via INTRAVENOUS
  Administered 2019-11-25 (×2): 50 ug via INTRAVENOUS

## 2019-11-25 MED ORDER — SUGAMMADEX SODIUM 200 MG/2ML IV SOLN
INTRAVENOUS | Status: DC | PRN
  Administered 2019-11-25: 200 mg via INTRAVENOUS

## 2019-11-25 MED ORDER — ONDANSETRON HCL 4 MG/2ML IJ SOLN
INTRAMUSCULAR | Status: AC
  Filled 2019-11-25: qty 2

## 2019-11-25 MED ORDER — KETOROLAC TROMETHAMINE 30 MG/ML IJ SOLN
INTRAMUSCULAR | Status: DC | PRN
  Administered 2019-11-25: 30 mg via INTRAVENOUS

## 2019-11-25 MED ORDER — CLINDAMYCIN PHOSPHATE 900 MG/50ML IV SOLN
900.0000 mg | INTRAVENOUS | Status: AC
  Administered 2019-11-25: 900 mg via INTRAVENOUS
  Filled 2019-11-25: qty 50

## 2019-11-25 MED ORDER — HYDROMORPHONE HCL 1 MG/ML IJ SOLN
INTRAMUSCULAR | Status: AC
  Filled 2019-11-25: qty 1

## 2019-11-25 MED ORDER — CLINDAMYCIN PHOSPHATE 900 MG/50ML IV SOLN
INTRAVENOUS | Status: AC
  Filled 2019-11-25: qty 50

## 2019-11-25 MED ORDER — SCOPOLAMINE 1 MG/3DAYS TD PT72
1.0000 | MEDICATED_PATCH | Freq: Once | TRANSDERMAL | Status: DC
  Administered 2019-11-25: 1.5 mg via TRANSDERMAL
  Filled 2019-11-25: qty 1

## 2019-11-25 MED ORDER — OXYCODONE HCL 5 MG PO TABS
ORAL_TABLET | ORAL | Status: AC
  Filled 2019-11-25: qty 1

## 2019-11-25 MED ORDER — HYDROMORPHONE HCL 1 MG/ML IJ SOLN
0.5000 mg | INTRAMUSCULAR | Status: DC | PRN
  Administered 2019-11-25 (×2): 0.5 mg via INTRAVENOUS
  Filled 2019-11-25: qty 0.5

## 2019-11-25 MED ORDER — EPHEDRINE SULFATE 50 MG/ML IJ SOLN
INTRAMUSCULAR | Status: DC | PRN
  Administered 2019-11-25 (×2): 20 mg via INTRAVENOUS

## 2019-11-25 MED ORDER — PHENYLEPHRINE 40 MCG/ML (10ML) SYRINGE FOR IV PUSH (FOR BLOOD PRESSURE SUPPORT)
PREFILLED_SYRINGE | INTRAVENOUS | Status: AC
  Filled 2019-11-25: qty 10

## 2019-11-25 MED ORDER — ROCURONIUM BROMIDE 10 MG/ML (PF) SYRINGE
PREFILLED_SYRINGE | INTRAVENOUS | Status: AC
  Filled 2019-11-25: qty 10

## 2019-11-25 MED ORDER — MIDAZOLAM HCL 2 MG/2ML IJ SOLN
INTRAMUSCULAR | Status: AC
  Filled 2019-11-25: qty 2

## 2019-11-25 MED ORDER — ROCURONIUM BROMIDE 100 MG/10ML IV SOLN
INTRAVENOUS | Status: DC | PRN
  Administered 2019-11-25 (×2): 20 mg via INTRAVENOUS
  Administered 2019-11-25: 50 mg via INTRAVENOUS

## 2019-11-25 MED ORDER — LIDOCAINE HCL (CARDIAC) PF 100 MG/5ML IV SOSY
PREFILLED_SYRINGE | INTRAVENOUS | Status: DC | PRN
  Administered 2019-11-25: 80 mg via INTRAVENOUS

## 2019-11-25 MED ORDER — ONDANSETRON HCL 4 MG/2ML IJ SOLN
INTRAMUSCULAR | Status: DC | PRN
  Administered 2019-11-25: 4 mg via INTRAVENOUS

## 2019-11-25 MED ORDER — GENTAMICIN SULFATE 40 MG/ML IJ SOLN
300.0000 mg | INTRAVENOUS | Status: AC
  Administered 2019-11-25: 300 mg via INTRAVENOUS
  Filled 2019-11-25: qty 7.5

## 2019-11-25 MED ORDER — OXYCODONE HCL 5 MG PO TABS
5.0000 mg | ORAL_TABLET | Freq: Once | ORAL | Status: DC | PRN
  Filled 2019-11-25: qty 1

## 2019-11-25 MED ORDER — PROPOFOL 10 MG/ML IV BOLUS
INTRAVENOUS | Status: AC
  Filled 2019-11-25: qty 40

## 2019-11-25 MED ORDER — LACTATED RINGERS IV SOLN
INTRAVENOUS | Status: DC
  Filled 2019-11-25: qty 1000

## 2019-11-25 MED ORDER — DEXAMETHASONE SODIUM PHOSPHATE 10 MG/ML IJ SOLN
INTRAMUSCULAR | Status: AC
  Filled 2019-11-25: qty 1

## 2019-11-25 MED ORDER — DEXAMETHASONE SODIUM PHOSPHATE 4 MG/ML IJ SOLN
INTRAMUSCULAR | Status: DC | PRN
  Administered 2019-11-25: 5 mg via INTRAVENOUS

## 2019-11-25 MED ORDER — ACETAMINOPHEN 325 MG PO TABS
650.0000 mg | ORAL_TABLET | ORAL | Status: DC | PRN
  Filled 2019-11-25: qty 2

## 2019-11-25 MED ORDER — OXYCODONE HCL 5 MG/5ML PO SOLN
5.0000 mg | Freq: Once | ORAL | Status: DC | PRN
  Filled 2019-11-25: qty 5

## 2019-11-25 MED ORDER — ACETAMINOPHEN 500 MG PO TABS
1000.0000 mg | ORAL_TABLET | Freq: Once | ORAL | Status: AC
  Administered 2019-11-25: 1000 mg via ORAL
  Filled 2019-11-25: qty 2

## 2019-11-25 MED ORDER — FENTANYL CITRATE (PF) 250 MCG/5ML IJ SOLN
INTRAMUSCULAR | Status: AC
  Filled 2019-11-25: qty 5

## 2019-11-25 SURGICAL SUPPLY — 59 items
ADH SKN CLS APL DERMABOND .7 (GAUZE/BANDAGES/DRESSINGS) ×1
BARRIER ADHS 3X4 INTERCEED (GAUZE/BANDAGES/DRESSINGS) IMPLANT
BRR ADH 4X3 ABS CNTRL BYND (GAUZE/BANDAGES/DRESSINGS)
CANISTER SUCT 3000ML PPV (MISCELLANEOUS) ×3 IMPLANT
CATH FOLEY 3WAY  5CC 16FR (CATHETERS) ×3
CATH FOLEY 3WAY 5CC 16FR (CATHETERS) ×1 IMPLANT
COVER BACK TABLE 60X90IN (DRAPES) ×3 IMPLANT
COVER TIP SHEARS 8 DVNC (MISCELLANEOUS) ×1 IMPLANT
COVER TIP SHEARS 8MM DA VINCI (MISCELLANEOUS) ×3
DECANTER SPIKE VIAL GLASS SM (MISCELLANEOUS) ×3 IMPLANT
DEFOGGER SCOPE WARMER CLEARIFY (MISCELLANEOUS) ×3 IMPLANT
DERMABOND ADVANCED (GAUZE/BANDAGES/DRESSINGS) ×2
DERMABOND ADVANCED .7 DNX12 (GAUZE/BANDAGES/DRESSINGS) ×1 IMPLANT
DRAPE ARM DVNC X/XI (DISPOSABLE) ×4 IMPLANT
DRAPE COLUMN DVNC XI (DISPOSABLE) ×1 IMPLANT
DRAPE DA VINCI XI ARM (DISPOSABLE) ×12
DRAPE DA VINCI XI COLUMN (DISPOSABLE) ×3
DURAPREP 26ML APPLICATOR (WOUND CARE) ×3 IMPLANT
ELECT REM PT RETURN 9FT ADLT (ELECTROSURGICAL) ×3
ELECTRODE REM PT RTRN 9FT ADLT (ELECTROSURGICAL) ×1 IMPLANT
GAUZE PETROLATUM 1 X8 (GAUZE/BANDAGES/DRESSINGS) ×3 IMPLANT
GLOVE BIO SURGEON STRL SZ 6.5 (GLOVE) ×6 IMPLANT
GLOVE BIO SURGEONS STRL SZ 6.5 (GLOVE) ×3
GLOVE BIOGEL PI IND STRL 7.0 (GLOVE) ×5 IMPLANT
GLOVE BIOGEL PI INDICATOR 7.0 (GLOVE) ×10
IRRIG SUCT STRYKERFLOW 2 WTIP (MISCELLANEOUS) ×3
IRRIGATION SUCT STRKRFLW 2 WTP (MISCELLANEOUS) ×1 IMPLANT
LEGGING LITHOTOMY PAIR STRL (DRAPES) ×3 IMPLANT
OBTURATOR OPTICAL STANDARD 8MM (TROCAR) ×3
OBTURATOR OPTICAL STND 8 DVNC (TROCAR) ×1
OBTURATOR OPTICALSTD 8 DVNC (TROCAR) ×1 IMPLANT
OCCLUDER COLPOPNEUMO (BALLOONS) ×3 IMPLANT
PACK ROBOT WH (CUSTOM PROCEDURE TRAY) ×3 IMPLANT
PACK ROBOTIC GOWN (GOWN DISPOSABLE) ×3 IMPLANT
PACK TRENDGUARD 450 HYBRID PRO (MISCELLANEOUS) ×1 IMPLANT
PAD PREP 24X48 CUFFED NSTRL (MISCELLANEOUS) ×3 IMPLANT
POUCH ENDO CATCH II 15MM (MISCELLANEOUS) IMPLANT
PROTECTOR NERVE ULNAR (MISCELLANEOUS) IMPLANT
RTRCTR WOUND ALEXIS 18CM SML (INSTRUMENTS)
SAVER CELL AAL HAEMONETICS (INSTRUMENTS) IMPLANT
SEAL CANN UNIV 5-8 DVNC XI (MISCELLANEOUS) ×4 IMPLANT
SEAL XI 5MM-8MM UNIVERSAL (MISCELLANEOUS) ×12
SET IRRIG Y TYPE TUR BLADDER L (SET/KITS/TRAYS/PACK) IMPLANT
SET TRI-LUMEN FLTR TB AIRSEAL (TUBING) ×3 IMPLANT
SUT ETHIBOND 0 (SUTURE) IMPLANT
SUT VIC AB 4-0 PS2 27 (SUTURE) ×9 IMPLANT
SUT VICRYL 0 UR6 27IN ABS (SUTURE) ×3 IMPLANT
SUT VLOC 180 0 9IN  GS21 (SUTURE) ×3
SUT VLOC 180 0 9IN GS21 (SUTURE) ×1 IMPLANT
SUT VLOC 180 2-0 6IN GS21 (SUTURE) IMPLANT
TIP RUMI ORANGE 6.7MMX12CM (TIP) IMPLANT
TIP UTERINE 5.1X6CM LAV DISP (MISCELLANEOUS) ×3 IMPLANT
TIP UTERINE 6.7X10CM GRN DISP (MISCELLANEOUS) IMPLANT
TIP UTERINE 6.7X6CM WHT DISP (MISCELLANEOUS) IMPLANT
TIP UTERINE 6.7X8CM BLUE DISP (MISCELLANEOUS) IMPLANT
TOWEL OR 17X26 10 PK STRL BLUE (TOWEL DISPOSABLE) ×3 IMPLANT
TRENDGUARD 450 HYBRID PRO PACK (MISCELLANEOUS) ×3
TROCAR PORT AIRSEAL 5X120 (TROCAR) ×3 IMPLANT
WATER STERILE IRR 1000ML POUR (IV SOLUTION) ×3 IMPLANT

## 2019-11-25 NOTE — Anesthesia Postprocedure Evaluation (Signed)
Anesthesia Post Note  Patient: Brandi Durham  Procedure(s) Performed: XI ROBOTIC ASSISTED TOTAL LAPAROSCOPIC HYSTERECTOMY WITH BILATERAL SALPINGECTOMY (Bilateral Abdomen)     Patient location during evaluation: PACU Anesthesia Type: General Level of consciousness: awake and alert and oriented Pain management: pain level controlled Vital Signs Assessment: post-procedure vital signs reviewed and stable Respiratory status: spontaneous breathing, nonlabored ventilation and respiratory function stable Cardiovascular status: blood pressure returned to baseline Postop Assessment: no apparent nausea or vomiting Anesthetic complications: no    Last Vitals:  Vitals:   11/25/19 1145 11/25/19 1200  BP: 118/75 123/79  Pulse: 73 68  Resp: 14 15  Temp:    SpO2: 99% 100%    Last Pain:  Vitals:   11/25/19 1200  TempSrc:   PainSc: [name]

## 2019-11-25 NOTE — Op Note (Addendum)
Operative Note  11/25/2019  12:25 PM  PATIENT:  Brandi Durham  44 y.o. female  PRE-OPERATIVE DIAGNOSIS:  Menorrhagia, secondary anemia  POST-OPERATIVE DIAGNOSIS:  Menorrhagia, secondary anemia  PROCEDURE:  Procedure(s): XI ROBOTIC ASSISTED TOTAL LAPAROSCOPIC HYSTERECTOMY WITH BILATERAL SALPINGECTOMY AND LYSIS OF ADHESIONS BETWEEN BLADDER AND UTERUS AND AT ADNEXAE  SURGEON:  Surgeon(s): [name], MD  ANESTHESIA:   general  FINDINGS: Uterus normal in size and appearance, tubes s/p Bilateral Sterilization, Ovaries normal.  Adhesions between the bladder and the lower uterine segment and adhesions at bilateral adnexae.  DESCRIPTION OF OPERATION: Under general anesthesia with endotracheal intubation the patient is in lithotomy position.  She is prepped with DuraPrep on the abdomen and with Betadine on the suprapubic, vulvar and vaginal areas.  The Foley is put in place in the bladder.  The patient is draped as usual.  Timeout is done.  Vaginally, the medium Koh ring with a #6 Rumi are put in place easily.  The other instruments are removed.  Infiltration of Marcaine quarter plain at the supraumbilical area and a 1.5 cm incision done with the scalpel at that level.  The aponeurosis is grasped with cokers and incised with Mayo scissors.  The parietal peritoneum is opened bluntly with the finger. A pursestring stitch of Vicryl 0 is done at the aponeurosis. The Sheryle Hail is inserted at that level and a pneumoperitoneum is created.  The camera is inserted.  The anterior wall of the abdomen is clear.  We therefore make small incisions with the scalpel after infiltrating with Marcaine one quarter plain at each level and insert the robotic ports and the assistant port under direct vision with 2 robotic ports on the right and 1 robotic port on the external left side of the abdomen and the 5 mm assistant port at the left medial side of the abdomen.  The patient is positioned in deep Trendelenburg.   The robot is docked.  Targeting is done.  The robotic instruments are inserted under direct vision with the fenestrated clamp in the fourth arm, the EndoShears scissors and the third arm, and the PK bipolar clamp in the first arm.  We go to the console.      The uterus is normal in size and appearance, the tubes are opposed bilateral tubal sterilization, both ovaries are normal in size and appearance.  Adhesions are present at the adnexa bilaterally.  The bladder is adherent to the lower uterine segment of the uterus secondary to previous C-sections.  Both ureters are in normal anatomic position with good peristalsis.      We started on the right side with lysis of adhesions, then we cauterized and sectioned the right mesosalpinx, we then cauterized and sectioned the right utero-ovarian ligament, and finally we cauterized and sectioned the right round ligament.  The visceral peritoneum is opened anteriorly.  We proceeded the same way on the left side.  We then completely opened the visceral peritoneum anteriorly and meticulously descended the bladder past the [facility] ring.  Adhesions were lysed high up on the lower uterine segment of the uterus.  The uterine arteries were skeletonized on either side.  We then cauterized the right uterine artery.  We catheterized the left uterine artery and sectioned it.  The right uterine artery was cauterized again and sectioned.  The vaginal occluder was inflated.  The top aspect of the vagina was opened over the Naylor ring anteriorly, laterally and posteriorly.  The uterus with cervix and bilateral tubes were removed  vaginally and sent to pathology.  Hemostasis was adequate at all levels.  We switched robotic instruments to the cutting needle driver in the third arm and the long tip in the first arm.  We connected the fenestrated clamp to energy.  We made sure that the bladder was sufficiently descended.  We then closed the vaginal cuff with a running suture of V-Loc 0 at 9 inches  starting at the right angle all the way to the left angle and then back to the midline.  The vaginal cuff was well closed and hemostasis was good.  The urine was clear and both ureters were seen with good peristalsis.  Pictures were taken before starting the procedure and at the end.  All instruments were removed under direct vision.  The robot was undocked.  The patient was flattened. The occluder was removed from the vagina.  The ports were removed.  The CO2 was evacuated.  The supraumbilical incision was closed by attaching the pursestring stitch at the aponeurosis.  We closed all incisions with a Monocryl 4-0 with a subcuticular suture.  Dermabond was added on all incisions.  Hemostasis was adequate.  The patient was brought to recovery room in good and stable status.  ESTIMATED BLOOD LOSS: 30 mL   Intake/Output Summary (Last 24 hours) at 11/25/2019 1225 Last data filed at 11/25/2019 1131 Gross per 24 hour  Intake 1100 ml  Output 230 ml  Net 870 ml     BLOOD ADMINISTERED:none   LOCAL MEDICATIONS USED:  MARCAINE     SPECIMEN:  Source of Specimen:  Uterus with cervix and bilateral tubes  DISPOSITION OF SPECIMEN:  PATHOLOGY  COUNTS:  YES  PLAN OF CARE: Transfer to PACU  Marie-Lyne LavoieMD12:25 PM

## 2019-11-25 NOTE — H&P (Addendum)
Brandi Durham is an 44 y.o. female.[id number] Married. Twin C/S and Ectopic x 1.    [id number] Menorrhagia/Pelvic Pain/Anemia for Robotic TLH/Bilateral Salpingectomy  HPI:No change x visit 11/15/2019:  Worsening long standing menorrhagia. Resistant to BCP and Nexplanon management. IV transfusions x 4 in December 2020, followed by Dr Jana Hakim for severe anemia. Took Megestrol from early January to now with decreased heavy flow, but persistent mild bleeding. Severe dysmenorrhea. No desire to preserve fertility.    Pertinent Gynecological History: Menses: flow is excessive with use of many pads or tampons on heaviest days Blood transfusions: none Sexually transmitted diseases: no past history Previous GYN Ob Procedures: LPS Ectopic/C/S x 2  Last mammogram: Rt normal, Lt breast US benign 09/2019 Last pap: normal    Menstrual History:  Patient's last menstrual period was 11/15/2019.    Past Medical History:  Diagnosis Date  . Back problem   . History of iritis   . Thrombocytosis ([place]) 05/20/2019    Past Surgical History:  Procedure Laterality Date  . ABDOMINAL HYSTERECTOMY    . ABDOMINAL SURGERY    . APPENDECTOMY    . BACK SURGERY    . CESAREAN SECTION    . KNEE SURGERY    . TUBAL LIGATION      Family History  Problem Relation Age of Onset  . Heart failure Father 50       Heart Failure  . Diabetes Mother   . Hypertension Mother   . Drug abuse Mother   . Heart attack Mother   . CAD Mother 67    Social History:  reports that she quit smoking about 4 months ago. Her smoking use included cigarettes. She has a 5.80 pack-year smoking history. She has never used smokeless tobacco. She reports current alcohol use. She reports that she does not use drugs.  Allergies:  Allergies  Allergen Reactions  . Doxycycline Nausea Only  . Penicillins Swelling    Did it involve swelling of the face/tongue/throat, SOB, or low BP? No Did it involve sudden or severe  rash/hives, skin peeling, or any reaction on the inside of your mouth or nose? No Did you need to seek medical attention at a hospital or doctor's office? No When did it last happen?childhood If all above answers are "NO", may proceed with cephalosporin use.   . Sulfur Hives    Medications Prior to Admission  Medication Sig Dispense Refill Last Dose  . ascorbic acid (VITAMIN C) 500 MG tablet Take 500 mg by mouth daily.   11/24/2019 at Unknown time    REVIEW OF SYSTEMS: A ROS was performed and pertinent positives and negatives are included in the history.  GENERAL: No fevers or chills. HEENT: No change in vision, no earache, sore throat or sinus congestion. NECK: No pain or stiffness. CARDIOVASCULAR: No chest pain or pressure. No palpitations. PULMONARY: No shortness of breath, cough or wheeze. GASTROINTESTINAL: No abdominal pain, nausea, vomiting or diarrhea, melena or bright red blood per rectum. GENITOURINARY: No urinary frequency, urgency, hesitancy or dysuria. MUSCULOSKELETAL: No joint or muscle pain, no back pain, no recent trauma. DERMATOLOGIC: No rash, no itching, no lesions. ENDOCRINE: No polyuria, polydipsia, no heat or cold intolerance. No recent change in weight. HEMATOLOGICAL: No anemia or easy bruising or bleeding. NEUROLOGIC: No headache, seizures, numbness, tingling or weakness. PSYCHIATRIC: No depression, no loss of interest in normal activity or change in sleep pattern.     Blood pressure 113/81, pulse 70, temperature 98 F (36.7 C), temperature source  Oral, resp. rate 14, height 5\' 2"  (1.575 m), weight 72.3 kg, last menstrual period 11/15/2019, SpO2 99 %.  Physical Exam:  See office notes   Results for orders placed or performed during the hospital encounter of 11/25/19 (from the past 24 hour(s))  Pregnancy, urine POC     Status: None   Collection Time: 11/25/19  7:23 AM  Result Value Ref Range   Preg Test, Ur NEGATIVE NEGATIVE   Covid Negative  Hb 12.5 on  11/15/2019  Pelvic US 10/03/2019: T/V images. Anteverted uterus normal size and shape with no myometrial mass. The uterus is measured at 7.77 x 4.99 x 4.26 cm. The endometrial lining is symmetrical measured at 5.19 mm with no mass, thickening or abnormal flow seen. Both ovaries are normal in size with normal follicular pattern. A simple dominant follicle measuring 1.5 x 1.3 cm is present on the left ovary. No adnexal mass seen. No free fluid in the posterior cul-de-sac.  Hb 12.6 on 10/08/2019   Assessment/Plan:  44 y.o. G2P0012   1. Menorrhagia with regular cycle Severe refractory menorrhagia resistant to birth control pills and Nexplanon.  Had to receive for iron transfusion in 2020.  Pelvic ultrasound showing a normal size uterus with a normal endometrial lining at 5.19 mm.  Patient had 2 C-sections and an ectopic pregnancy.  Therefore the decision was made to use a robotic approach.  Will proceed with an XI robotic total laparoscopic hysterectomy with bilateral salpingectomy on November 25, 2019.  Preop preparation, surgery with risks and postop precautions reviewed.  Patient agrees with plan and does not have further questions.  2. Secondary anemia Last Hb on Megace normal at 12.6.                        Patient was counseled as to the risk of surgery to include the following:  1. Infection (prohylactic antibiotics will be administered)  2. DVT/Pulmonary Embolism (prophylactic pneumo compression stockings will be used)  3.Trauma to internal organs requiring additional surgical procedure to repair any injury to internal organs requiring perhaps additional hospitalization days.  4.Hemmorhage requiring transfusion and blood products which carry risks such as anaphylactic reaction, hepatitis and AIDS  Patient had received literature information on the procedure scheduled and all her questions were answered and fully accepts all risk.  Brandi Durham 11/25/2019, 8:23 AM

## 2019-11-25 NOTE — Anesthesia Procedure Notes (Addendum)
Procedure Name: Intubation Date/Time: 11/25/2019 8:38 AM Performed by: Justice Rocher, CRNA Pre-anesthesia Checklist: Patient identified, Emergency Drugs available, Suction available, Patient being monitored and Timeout performed Patient Re-evaluated:Patient Re-evaluated prior to induction Oxygen Delivery Method: Circle system utilized Preoxygenation: Pre-oxygenation with 100% oxygen Induction Type: IV induction Ventilation: Mask ventilation without difficulty Laryngoscope Size: Mac and 3 Grade View: Grade II Tube type: Oral Tube size: 7.0 mm Number of attempts: 1 Airway Equipment and Method: Stylet and Oral airway Placement Confirmation: ETT inserted through vocal cords under direct vision,  positive ETCO2,  breath sounds checked- equal and bilateral and CO2 detector Secured at: 22 cm Tube secured with: Tape Dental Injury: Teeth and Oropharynx as per pre-operative assessment  Comments: Pink foam headrest placed over patients face for protection

## 2019-11-25 NOTE — Transfer of Care (Signed)
Immediate Anesthesia Transfer of Care Note  Patient: Brandi Durham  Procedure(s) Performed: Procedure(s) (LRB): XI ROBOTIC ASSISTED TOTAL LAPAROSCOPIC HYSTERECTOMY WITH BILATERAL SALPINGECTOMY (Bilateral)  Patient Location: PACU  Anesthesia Type: General  Level of Consciousness: awake, sedated, patient cooperative and responds to stimulation  Airway & Oxygen Therapy: Patient Spontanous Breathing and Patient connected to [place] O2 and soft FM   Post-op Assessment: Report given to PACU RN, Post -op Vital signs reviewed and stable and Patient moving all extremities  Post vital signs: Reviewed and stable  Complications: No apparent anesthesia complications

## 2019-11-26 DIAGNOSIS — N920 Excessive and frequent menstruation with regular cycle: Secondary | ICD-10-CM | POA: Diagnosis not present

## 2019-11-26 LAB — CBC
HCT: 33.4 % — ABNORMAL LOW (ref 36.0–46.0)
Hemoglobin: 10.7 g/dL — ABNORMAL LOW (ref 12.0–15.0)
MCH: 27.6 pg (ref 26.0–34.0)
MCHC: 32 g/dL (ref 30.0–36.0)
MCV: 86.1 fL (ref 80.0–100.0)
Platelets: 411 10*3/uL — ABNORMAL HIGH (ref 150–400)
RBC: 3.88 MIL/uL (ref 3.87–5.11)
RDW: 15.8 % — ABNORMAL HIGH (ref 11.5–15.5)
WBC: 12 10*3/uL — ABNORMAL HIGH (ref 4.0–10.5)
nRBC: 0 % (ref 0.0–0.2)

## 2019-11-26 LAB — SURGICAL PATHOLOGY

## 2019-11-26 MED ORDER — OXYCODONE HCL 5 MG PO TABS
ORAL_TABLET | ORAL | Status: AC
  Filled 2019-11-26: qty 1

## 2019-11-26 MED ORDER — DOCUSATE SODIUM 100 MG PO CAPS: 100.0000 mg | ORAL_CAPSULE | Freq: Two times a day (BID) | ORAL | 0 refills | Status: DC

## 2019-11-26 MED ORDER — HYDROCODONE-ACETAMINOPHEN 5-325 MG PO TABS
ORAL_TABLET | ORAL | Status: AC
  Filled 2019-11-26: qty 2

## 2019-11-26 MED ORDER — ACETAMINOPHEN 325 MG PO TABS
ORAL_TABLET | ORAL | Status: AC
  Filled 2019-11-26: qty 2

## 2019-11-26 MED ORDER — OXYCODONE-ACETAMINOPHEN 7.5-325 MG PO TABS: 1.0000 | ORAL_TABLET | ORAL | 0 refills | Status: DC | PRN

## 2019-11-26 MED ORDER — OXYCODONE-ACETAMINOPHEN 5-325 MG PO TABS
ORAL_TABLET | ORAL | Status: AC
  Filled 2019-11-26: qty 2

## 2019-11-26 MED ORDER — OXYCODONE-ACETAMINOPHEN 5-325 MG PO TABS
ORAL_TABLET | ORAL | Status: AC
  Filled 2019-11-26: qty 1

## 2019-11-26 MED ORDER — IBUPROFEN 800 MG PO TABS: 800.0000 mg | ORAL_TABLET | Freq: Three times a day (TID) | ORAL | 1 refills | Status: DC | PRN

## 2019-11-26 NOTE — Discharge Instructions (Signed)

## 2019-11-26 NOTE — Progress Notes (Signed)
Pt up to ambulate in hallway, abdomen distended and pt complaining of right shoulder pain, ambulated 100 feet and pt states she is dizzy and needs to lie back down.  Pt place in bed in high fowlers with heating pad to right shoulder. Directed pt to drink plenty of fluids and plan to ambulate again in 30 minutes.

## 2019-11-26 NOTE — Discharge Summary (Signed)
Physician Discharge Summary  Patient ID: Brandi Durham MRN: [id number] DOB/AGE: 1975/10/10 44 y.o.  Admit date: 11/25/2019 Discharge date: 11/26/2019  Admission Diagnoses: Postoperative state [Z98.890]   Discharge Diagnoses: Same Active Problems:   Postoperative state   Discharged Condition: good  Consults:None  Significant Diagnostic Studies: labs: Post op Hb 10.6   Treatments:surgery: XI Robotic TLH/Bilateral Salpingectomy/Lysis of Adhesions  Vitals:   11/26/19 0819 11/26/19 0858  BP:  104/64  Pulse:    Resp: 16   Temp: 98.2 F (36.8 C)   SpO2: 97%      No intake/output data recorded.   Hospital Course: Good  Discharge Exam: Normal Postop  Disposition: Discharge disposition: 01-Home or Self Care       Discharge Instructions    Discharge patient   Complete by: As directed    Discharge disposition: 01-Home or Self Care   Discharge patient date: 11/26/2019       Allergies as of 11/26/2019      Reactions   Doxycycline Nausea Only   Penicillins Swelling   Did it involve swelling of the face/tongue/throat, SOB, or low BP? No Did it involve sudden or severe rash/hives, skin peeling, or any reaction on the inside of your mouth or nose? No Did you need to seek medical attention at a hospital or doctor's office? No When did it last happen?childhood If all above answers are "NO", may proceed with cephalosporin use.   Sulfur Hives      Medication List    TAKE these medications   ascorbic acid 500 MG tablet Commonly known as: VITAMIN C Take 500 mg by mouth daily.   oxyCODONE-acetaminophen 7.5-325 MG tablet Commonly known as: Percocet Take 1 tablet by mouth every 4 (four) hours as needed for severe pain. Notes to patient: May take next dose at 10:00 am        Follow-up Information    [name], MD. Call in 3 week(s).   Specialty: Obstetrics and Gynecology Contact information: [place]  [address] [phone]            Signed: [name] 11/26/2019, 9:06 AM

## 2019-11-26 NOTE — Progress Notes (Signed)
PO#1 XI Robotic TLH/Bilateral Salpingectomy/Lysis of Adhesions  Subjective: Patient reports tolerating PO, + flatus and no problems voiding.    Objective: I have reviewed patient's vital signs.  vital signs, intake and output, medications and labs.  Vitals:   11/26/19 0209 11/26/19 0549  BP: (!) 90/52 103/69  Pulse: 65 (!) 55  Resp: 16 16  Temp: 98.4 F (36.9 C) 98.1 F (36.7 C)  SpO2: 96% 97%   I/O last 3 completed shifts: In: 2715.4 [P.O.:480; I.V.:2235.4] Out: 2930 [Urine:2900; Blood:30] No intake/output data recorded.  Results for orders placed or performed during the hospital encounter of 11/25/19 (from the past 24 hour(s))  CBC     Status: Abnormal   Collection Time: 11/26/19 12:52 AM  Result Value Ref Range   WBC 12.0 (H) 4.0 - 10.5 K/uL   RBC 3.88 3.87 - 5.11 MIL/uL   Hemoglobin 10.7 (L) 12.0 - 15.0 g/dL   HCT 33.4 (L) 36.0 - 46.0 %   MCV 86.1 80.0 - 100.0 fL   MCH 27.6 26.0 - 34.0 pg   MCHC 32.0 30.0 - 36.0 g/dL   RDW 15.8 (H) 11.5 - 15.5 %   Platelets 411 (H) 150 - 400 K/uL   nRBC 0.0 0.0 - 0.2 %    EXAM General: alert, cooperative and appears stated age Resp: clear to auscultation bilaterally Cardio: regular rate and rhythm GI: soft, non-tender; bowel sounds normal; no masses,  no organomegaly and incision: clean, dry and intact Extremities: no edema, redness or tenderness in the calves or thighs Vaginal Bleeding: none  Assessment: s/p Procedure(s): XI ROBOTIC ASSISTED TOTAL LAPAROSCOPIC HYSTERECTOMY WITH BILATERAL SALPINGECTOMY: stable, progressing well and tolerating diet  Plan: Advance diet Discontinue IV fluids Discharge home  LOS: 0 days    [name], MD 11/26/2019 8:17 AM    11/26/2019, 8:17 AM

## 2019-11-26 NOTE — Progress Notes (Signed)
Call to Dr Dellis Filbert to notify of BP 91/55 with dizziness, abdomen distention and tenderness.  Dr Dellis Filbert agreed that abdomen is distended but with adequate bowel sounds.  Nurse to ambulate patient again this morning and push oral fluids.  Observe for 2 additional hours, recheck BP and update Dr Dellis Filbert as needed before discharging patient home.

## 2019-11-29 NOTE — Telephone Encounter (Signed)
Called patient and per DPR access note on file I Left detailed message in voice mail with normal result.

## 2019-12-12 ENCOUNTER — Other Ambulatory Visit: Payer: Self-pay

## 2019-12-16 ENCOUNTER — Encounter: Payer: Self-pay | Admitting: Obstetrics & Gynecology

## 2019-12-16 ENCOUNTER — Telehealth: Payer: Self-pay | Admitting: *Deleted

## 2019-12-16 ENCOUNTER — Other Ambulatory Visit: Payer: Self-pay

## 2019-12-16 ENCOUNTER — Ambulatory Visit (INDEPENDENT_AMBULATORY_CARE_PROVIDER_SITE_OTHER): Payer: 59 | Admitting: Obstetrics & Gynecology

## 2019-12-16 VITALS — BP 126/80

## 2019-12-16 DIAGNOSIS — Z09 Encounter for follow-up examination after completed treatment for conditions other than malignant neoplasm: Secondary | ICD-10-CM

## 2019-12-16 MED ORDER — OXYCODONE-ACETAMINOPHEN 2.5-325 MG PO TABS: 1.0000 | ORAL_TABLET | Freq: Three times a day (TID) | ORAL | 0 refills | Status: DC | PRN

## 2019-12-16 NOTE — Patient Instructions (Signed)
1. Status post gynecological surgery, follow-up exam Very good postop healing.  No Cx.  Pathology benign, adenomyosis.  Wants to start back to work 4/7th, no lifting.  Precautions reviewed.  Percocet 1 tablet at bedtime for pain after a full day on her feet.  Prescription for 12 tablets with no refill sent to pharmacy.  No intercourse until next exam.  F/U 4 weeks for vaginal vault check.  Other orders - Polyethylene Glycol 3350 (MIRALAX PO); Take by mouth. - oxycodone-acetaminophen (PERCOCET) 2.5-325 MG tablet; Take 1 tablet by mouth every 8 hours as needed for pain. #12, no refill.

## 2019-12-16 NOTE — Progress Notes (Signed)
    Brandi Durham Jan 14, 1976 [id number]        44 y.o.  [id number]   RP: Postop XI Robotic TLH/Bilateral Salpingectomy 11/25/2019  HPI: Very good postop evolution/healing.  Incisions well closed and not painful.  No vaginal bleeding or discharge.  No abdominal pelvic pain.  But she uses 1 Percocet at bedtime after a full day on her feet.  Urine and bowel movements normal.  No fever.   OB History  Gravida Para Term Preterm AB Living  2 1     1 2   SAB TAB Ectopic Multiple Live Births      1 1      # Outcome Date GA Lbr Len/2nd Weight Sex Delivery Anes PTL Lv  2 Ectopic           1 Para             Past medical history,surgical history, problem list, medications, allergies, family history and social history were all reviewed and documented in the EPIC chart.   Directed ROS with pertinent positives and negatives documented in the history of present illness/assessment and plan.  Exam:  Vitals:   12/16/19 1053  BP: 126/80   General appearance:  Normal  Abdomen: Normal.  Incisions well closed.  No erythema.  No drainage.  Gynecologic exam: Vulva normal.  Speculum:  Vaginal vault well closed.  No abnormal discharge, no bleeding.  FINAL MICROSCOPIC DIAGNOSIS:   A. UTERUS, CERVIX AND BILATERAL FALLOPIAN TUBES, HYSTERECTOMY WITH  BILATERAL SALPINGECTOMY:   Cervix:  - Mild acute and chronic cervicitis. Nabothian cysts.   Uterus:  - Endometrium: Attenuated inactive endometrium.  - Myometrium: Adenomyosis.  - Serosa: No significant histopathologic findings.   Adnexa:  - Fallopian tubes: No significant histopathologic findings.    Assessment/Plan:  44 y.o. G2P0012   1. Status post gynecological surgery, follow-up exam Very good postop healing.  No Cx.  Pathology benign, adenomyosis.  Wants to start back to work 4/7th, no lifting.  Precautions reviewed.  Percocet 1 tablet at bedtime for pain after a full day on her feet.  Prescription for 12 tablets with no refill sent to  pharmacy.  No intercourse until next exam.  F/U 4 weeks for vaginal vault check.  Other orders - Polyethylene Glycol 3350 (MIRALAX PO); Take by mouth. - oxycodone-acetaminophen (PERCOCET) 2.5-325 MG tablet; Take 1 tablet by mouth every 8 hours as needed for pain. #12, no refill.  [name] MD, 11:11 AM 12/16/2019

## 2019-12-16 NOTE — Telephone Encounter (Signed)
Pharmacy called regarding Rx for Percocet 2.5-325 mg tablet, reports this medication is not available. The pharmacist said they do have Percocet 5mg -325 mg dose is stock. If you wish to prescribe this dose, you may send Rx to [facility] [place] [facility].

## 2019-12-18 ENCOUNTER — Other Ambulatory Visit: Payer: Self-pay | Admitting: Obstetrics & Gynecology

## 2019-12-18 MED ORDER — OXYCODONE-ACETAMINOPHEN 5-325 MG PO TABS: 1.0000 | ORAL_TABLET | Freq: Every day | ORAL | 0 refills | Status: DC | PRN

## 2019-12-19 NOTE — Telephone Encounter (Signed)
Dr.Lavoie sent Rx.

## 2019-12-20 ENCOUNTER — Ambulatory Visit: Payer: 59

## 2019-12-20 ENCOUNTER — Ambulatory Visit: Payer: 59 | Attending: Internal Medicine

## 2019-12-20 DIAGNOSIS — Z23 Encounter for immunization: Secondary | ICD-10-CM

## 2019-12-20 NOTE — Progress Notes (Signed)
   Covid-19 Vaccination Clinic  Name:  Brandi Durham    MRN: [id number] DOB: 12/27/1975  12/20/2019  Brandi Durham was observed post Covid-19 immunization for 15 minutes without incident. She was provided with Vaccine Information Sheet and instruction to access the V-Safe system.   Brandi Durham was instructed to call 911 with any severe reactions post vaccine: Marland Kitchen Difficulty breathing  . Swelling of face and throat  . A fast heartbeat  . A bad rash all over body  . Dizziness and weakness   Immunizations Administered    Name Date Dose VIS Date Route   Pfizer COVID-19 Vaccine 12/20/2019  1:30 PM 0.3 mL 08/23/2019 Intramuscular   Manufacturer: [place]   Lot: [id number]   Scraper: [id number]

## 2019-12-26 ENCOUNTER — Other Ambulatory Visit: Payer: Self-pay | Admitting: *Deleted

## 2019-12-26 ENCOUNTER — Encounter: Payer: Self-pay | Admitting: Family Medicine

## 2019-12-26 ENCOUNTER — Other Ambulatory Visit: Payer: Self-pay

## 2019-12-26 ENCOUNTER — Ambulatory Visit: Payer: 59 | Admitting: Family Medicine

## 2019-12-26 VITALS — BP 112/64 | HR 76 | Wt 160.0 lb

## 2019-12-26 DIAGNOSIS — D473 Essential (hemorrhagic) thrombocythemia: Secondary | ICD-10-CM

## 2019-12-26 DIAGNOSIS — D500 Iron deficiency anemia secondary to blood loss (chronic): Secondary | ICD-10-CM

## 2019-12-26 DIAGNOSIS — H6121 Impacted cerumen, right ear: Secondary | ICD-10-CM

## 2019-12-26 DIAGNOSIS — D75839 Thrombocytosis, unspecified: Secondary | ICD-10-CM

## 2019-12-26 DIAGNOSIS — H8111 Benign paroxysmal vertigo, right ear: Secondary | ICD-10-CM

## 2019-12-26 MED ORDER — MECLIZINE HCL 25 MG PO TABS: 25.0000 mg | ORAL_TABLET | Freq: Two times a day (BID) | ORAL | 0 refills | Status: DC | PRN

## 2019-12-26 NOTE — Progress Notes (Signed)
Subjective:    Patient ID: Brandi Durham Brandi Durham, female    DOB: 02-16-1976, 44 y.o.   MRN: [id number]  HPI Chief Complaint  Patient presents with  . dizzy spell    2 days in a row- work has no AC, hadn't eatten so thought it was that. today woke up this am and was dizzy. had covid vaccine last friday. ear is stopped and some headache   Complains of intermittent dizziness since yesterday morning.  States she feels like her right ear is clogged. Decreased hearing in her right ear.   States she went to work yesterday. States there was no A/C at her job and she was very hot. She felt like she "passed out" at work yesterday in the bathroom. Denies falling or getting injured.   States this morning when she woke up and turned her head, the room was spinning. It only lasted for one minute.   States she had a headache since getting her Covid vaccine on 12/20/2019. She got the [employer] vaccine. Her headache has improved.   Denies fever, chills, chest pain, palpitations, shortness of breath, abdominal pain, N/V/D, urinary symptoms, LE edema.   States she had a hysterectomy on 11/25/2019.  This was done by Dr. Dellis Filbert. She is doing well.   Reviewed allergies, medications, past medical, surgical, family, and social history.    Review of Systems Pertinent positives and negatives in the history of present illness.     Objective:   Physical Exam Constitutional:      Appearance: Normal appearance.  HENT:     Right Ear: There is impacted cerumen.     Left Ear: Tympanic membrane and ear canal normal.  Eyes:     General: Lids are normal. Vision grossly intact. No visual field deficit or scleral icterus.    Extraocular Movements: Extraocular movements intact.     Right eye: Nystagmus present.     Conjunctiva/sclera: Conjunctivae normal.     Pupils: Pupils are equal, round, and reactive to light.  Cardiovascular:     Rate and Rhythm: Normal rate and regular rhythm.     Pulses: Normal pulses.    Heart sounds: Normal heart sounds.  Pulmonary:     Effort: Pulmonary effort is normal.     Breath sounds: Normal breath sounds.  Musculoskeletal:        General: Normal range of motion.     Cervical back: Full passive range of motion without pain and neck supple.     Right lower leg: No edema.     Left lower leg: No edema.  Skin:    General: Skin is warm and dry.     Capillary Refill: Capillary refill takes less than 2 seconds.     Coloration: Skin is not pale.  Neurological:     General: No focal deficit present.     Mental Status: She is alert and oriented to person, place, and time.     Cranial Nerves: Cranial nerves are intact. No facial asymmetry.     Sensory: Sensation is intact.     Motor: Motor function is intact. No pronator drift.     Coordination: Coordination is intact. Romberg sign negative. Finger-Nose-Finger Test normal.     Gait: Gait is intact.     Deep Tendon Reflexes: Reflexes are normal and symmetric.  Psychiatric:        Attention and Perception: Attention normal.        Mood and Affect: Mood normal.  Speech: Speech normal.        Behavior: Behavior normal.        Thought Content: Thought content normal.    BP 112/64 (BP Location: Right Arm, Patient Position: Standing)   Pulse 76   Wt 160 lb (72.6 kg)   LMP 11/15/2019   BMI 29.26 kg/m      Assessment & Plan:  Vertigo, benign paroxysmal, right - Plan: meclizine (ANTIVERT) 25 MG tablet  Hearing loss due to cerumen impaction, right  She is not postural. No red flag symptoms.  Normal neurological exam Dizziness sensation triggered by flexion of head and right head turn.  She appears to have vertigo and I will treat her with meclizine.  Discussed that this medication may be sedating.  Encouraged her to change positions slowly.  Encouraged hydration. Right ear with cerumen impaction.  This was disimpacted by my CMA, Sabrina. Normal TM post lavage.  Discussed strokelike symptoms and if she notices  any new or worsening symptoms she will call 911 or go to the ED. She will follow-up with me if symptoms are not improving Work note for today provided per patient request

## 2019-12-26 NOTE — Patient Instructions (Signed)
Try the meclizine as discussed. If the whole tablet makes you too drowsy then you can take 1/2 tablet (12.5 mg).  Stay well hydrated.   If your dizziness is not improving or if you are getting worse, let me know.   If you were to develop stroke-like symptoms as we discussed then you would need to call 911 or go to the emergency department. I do not suspect this will happen at all.     Vertigo Vertigo is the feeling that you or your surroundings are moving when they are not. This feeling can come and go at any time. Vertigo often goes away on its own. Vertigo can be dangerous if it occurs while you are doing something that could endanger you or others, such as driving or operating machinery. Your health care provider will do tests to determine the cause of your vertigo. Tests will also help your health care provider decide how best to treat your condition. Follow these instructions at home: Eating and drinking      Drink enough fluid to keep your urine pale yellow.  Do not drink alcohol. Activity  Return to your normal activities as told by your health care provider. Ask your health care provider what activities are safe for you.  In the morning, first sit up on the side of the bed. When you feel okay, stand slowly while you hold onto something until you know that your balance is fine.  Move slowly. Avoid sudden body or head movements or certain positions, as told by your health care provider.  If you have trouble walking or keeping your balance, try using a cane for stability. If you feel dizzy or unstable, sit down right away.  Avoid doing any tasks that would cause danger to you or others if vertigo occurs.  Avoid bending down if you feel dizzy. Place items in your home so that they are easy for you to reach without leaning over.  Do not drive or use heavy machinery if you feel dizzy. General instructions  Take over-the-counter and prescription medicines only as told by your  health care provider.  Keep all follow-up visits as told by your health care provider. This is important. Contact a health care provider if:  Your medicines do not relieve your vertigo or they make it worse.  You have a fever.  Your condition gets worse or you develop new symptoms.  Your family or friends notice any behavioral changes.  Your nausea or vomiting gets worse.  You have numbness or a prickling and tingling sensation in part of your body. Get help right away if you:  Have difficulty moving or speaking.  Are always dizzy.  Faint.  Develop severe headaches.  Have weakness in your hands, arms, or legs.  Have changes in your hearing or vision.  Develop a stiff neck.  Develop sensitivity to light. Summary  Vertigo is the feeling that you or your surroundings are moving when they are not.  Your health care provider will do tests to determine the cause of your vertigo.  Follow instructions for home care. You may be told to avoid certain tasks, positions, or movements.  Contact a health care provider if your medicines do not relieve your symptoms, or if you have a fever, nausea, vomiting, or changes in behavior.  Get help right away if you have severe headaches or difficulty speaking, or you develop hearing or vision problems. This information is not intended to replace advice given to you by  your health care provider. Make sure you discuss any questions you have with your health care provider. Document Revised: 07/23/2018 Document Reviewed: 07/23/2018 Elsevier Patient Education  2020 [employer].

## 2019-12-27 ENCOUNTER — Other Ambulatory Visit: Payer: 59

## 2019-12-30 NOTE — Progress Notes (Signed)
No show

## 2019-12-31 ENCOUNTER — Inpatient Hospital Stay: Payer: 59 | Attending: Adult Health | Admitting: Oncology

## 2019-12-31 ENCOUNTER — Encounter: Payer: Self-pay | Admitting: Oncology

## 2019-12-31 DIAGNOSIS — D473 Essential (hemorrhagic) thrombocythemia: Secondary | ICD-10-CM

## 2019-12-31 DIAGNOSIS — D500 Iron deficiency anemia secondary to blood loss (chronic): Secondary | ICD-10-CM

## 2020-01-10 ENCOUNTER — Other Ambulatory Visit: Payer: Self-pay

## 2020-01-13 ENCOUNTER — Ambulatory Visit: Payer: 59 | Admitting: Obstetrics & Gynecology

## 2020-01-15 ENCOUNTER — Ambulatory Visit: Payer: 59 | Attending: Internal Medicine

## 2020-01-15 DIAGNOSIS — Z23 Encounter for immunization: Secondary | ICD-10-CM

## 2020-01-15 NOTE — Progress Notes (Signed)
   Covid-19 Vaccination Clinic  Name:  Brandi Durham    MRN: [id number] DOB: 05/10/1976  01/15/2020  [name] was observed post Covid-19 immunization for 30 minutes based on pre-vaccination screening without incident. She was provided with Vaccine Information Sheet and instruction to access the V-Safe system.   [name] was instructed to call 911 with any severe reactions post vaccine: Marland Kitchen Difficulty breathing  . Swelling of face and throat  . A fast heartbeat  . A bad rash all over body  . Dizziness and weakness   Immunizations Administered    Name Date Dose VIS Date Route   Pfizer COVID-19 Vaccine 01/15/2020  4:54 PM 0.3 mL 11/06/2018 Intramuscular   Manufacturer: [place]   Lot: [id number]   [place]: [id number]

## 2020-01-28 ENCOUNTER — Other Ambulatory Visit: Payer: Self-pay

## 2020-01-29 ENCOUNTER — Encounter: Payer: Self-pay | Admitting: Obstetrics & Gynecology

## 2020-01-29 ENCOUNTER — Ambulatory Visit (INDEPENDENT_AMBULATORY_CARE_PROVIDER_SITE_OTHER): Payer: 59 | Admitting: Obstetrics & Gynecology

## 2020-01-29 VITALS — BP 124/86

## 2020-01-29 DIAGNOSIS — Z09 Encounter for follow-up examination after completed treatment for conditions other than malignant neoplasm: Secondary | ICD-10-CM

## 2020-01-29 NOTE — Progress Notes (Signed)
    Brandi Durham [birth year] [id number]        44 y.o.  [id number]   RP:  Postop XI Robotic TLH/Bilateral Salpingectomy 11/25/2019  HPI: Has been working with reduced lifting early after surgery.  Very good postop evolution/healing.  Incisions well closed and not painful.  No vaginal bleeding or discharge.  No abdominal pelvic pain.  Urine and bowel movements normal.  No fever.    OB History  Gravida Para Term Preterm AB Living  2 1     1 2   SAB TAB Ectopic Multiple Live Births      1 1      # Outcome Date GA Lbr Len/2nd Weight Sex Delivery Anes PTL Lv  2 Ectopic           1 Para             Past medical history,surgical history, problem list, medications, allergies, family history and social history were all reviewed and documented in the EPIC chart.   Directed ROS with pertinent positives and negatives documented in the history of present illness/assessment and plan.  Exam:  Vitals:   01/29/20 1104  BP: 124/86   General appearance:  Normal  Abdomen: Normal.  Incisions all very well healed.  Very small stitch at skin on left incision, pulled with pick-up and cut with scissors.    Gynecologic exam: Vulva normal.  Bimanual exam:  Vaginal vault well closed, no induration, NT.  No pelvic mass.   Assessment/Plan:  44 y.o. G2P0012   1. Status post gynecological surgery, follow-up exam Excellent postop healing completed.  May resume all physical and sexual activities.  Follow-up when due for annual gynecologic exam.   Brandi Bruins MD, 12:33 PM 01/29/2020

## 2020-01-29 NOTE — Patient Instructions (Signed)
1. Status post gynecological surgery, follow-up exam Excellent postop healing completed.  May resume all physical and sexual activities.  Follow-up when due for annual gynecologic exam.  April, it was a pleasure seeing you today!

## 2020-04-22 NOTE — Progress Notes (Signed)
Chief Complaint  Patient presents with  . Vaginal Discharge    for a few days. States that it is white, not itchy or painful. No burning. Slight odor. Has total hysterectomy in March.    She noted a small amount of white vaginal discharge 2 days ago.  There is slight odor, no itching.  She had hysterectomy 11/2019 with Dr. Dellis Filbert for menorrhagia/pelvic pain. She still has her ovaries.  She had negative STD screen in 05/2019 (blood and via pap).  In monogamous relationship without concerns.   PMH, PSH, SH reviewed Notable for recent hysterectomy and bilateral salpingectomy (still has ovaries), and thrombocytosis  Outpatient Encounter Medications as of 04/23/2020  Medication Sig  . ascorbic acid (VITAMIN C) 500 MG tablet Take 500 mg by mouth daily.  Marland Kitchen ibuprofen (ADVIL) 800 MG tablet Take 1 tablet (800 mg total) by mouth every 8 (eight) hours as needed. (Patient not taking: Reported on 04/23/2020)  . Polyethylene Glycol 3350 (MIRALAX PO) Take by mouth. (Patient not taking: Reported on 04/23/2020)  . [DISCONTINUED] meclizine (ANTIVERT) 25 MG tablet Take 1 tablet (25 mg total) by mouth 2 (two) times daily as needed for dizziness. (Patient not taking: Reported on 04/23/2020)   No facility-administered encounter medications on file as of 04/23/2020.   Allergies  Allergen Reactions  . Doxycycline Nausea Only  . Penicillins Swelling    Did it involve swelling of the face/tongue/throat, SOB, or low BP? No Did it involve sudden or severe rash/hives, skin peeling, or any reaction on the inside of your mouth or nose? No Did you need to seek medical attention at a hospital or doctor's office? No When did it last happen?childhood If all above answers are "NO", may proceed with cephalosporin use.   . Sulfur Hives    ROS: no fever, chills, URI symptoms, GI complaints, urinary complaints. No bleeding, bruising, rash.  Vaginal discharge per HPI.   PHYSICAL EXAM:  BP 110/60   Pulse 80   Temp  98.1 F (36.7 C) (Tympanic)   Ht 5\' 2"  (1.575 m)   Wt 167 lb 3.2 oz (75.8 kg)   LMP 11/15/2019   BMI 30.58 kg/m   Wt Readings from Last 3 Encounters:  04/23/20 167 lb 3.2 oz (75.8 kg)  12/26/19 160 lb (72.6 kg)  11/25/19 159 lb 6.4 oz (72.3 kg)   Pleasant, well-appearing female, in good spirits, in no distress HEENT: conjunctiva and sclera are clear, EOMI. Wearing mask Neck: no lymphadenopathy Heart: regular rate and rhythm Lungs: clear bilaterally Back: no spinal or CVA tenderness Abdomen: soft, nontender, no mass Extremities: no edema.  Skin: +tattoos. Normal turgor, no rash External genitalia--normal, no rashes or lesions.   Small-mod amount of white discharge, slightly thicker than normal.  No odor. Pelvic exam--nontender Extremities: no edema  KOH and wet prep: Only rare hyphae noted (1-2) on entire slide under KOH. Rest of wet prep normal, no trich, no BV, PMNs or bacteria  ASSESSMENT/PLAN:  Vaginal discharge - Ddx includes physiologic discharge vs very early yeast infection.   - Plan: POCT Wet Prep ([place]et [place])  Yeast vaginitis - to start Diflucan if discharge worsens or itching develops - Plan: fluconazole (DIFLUCAN) 150 MG tablet  Poss very early yeast infection. Can hold off on starting diflucan for a day or two, start if any worsening symptoms of discharge or itching.  Preferred diflucan to OTC Monistat

## 2020-04-23 ENCOUNTER — Other Ambulatory Visit: Payer: Self-pay

## 2020-04-23 ENCOUNTER — Telehealth: Payer: Self-pay | Admitting: *Deleted

## 2020-04-23 ENCOUNTER — Encounter: Payer: Self-pay | Admitting: Family Medicine

## 2020-04-23 ENCOUNTER — Ambulatory Visit (INDEPENDENT_AMBULATORY_CARE_PROVIDER_SITE_OTHER): Payer: 59 | Admitting: Family Medicine

## 2020-04-23 VITALS — BP 110/60 | HR 80 | Temp 98.1°F | Ht 62.0 in | Wt 167.2 lb

## 2020-04-23 DIAGNOSIS — B373 Candidiasis of vulva and vagina: Secondary | ICD-10-CM | POA: Diagnosis not present

## 2020-04-23 DIAGNOSIS — B3731 Acute candidiasis of vulva and vagina: Secondary | ICD-10-CM

## 2020-04-23 DIAGNOSIS — N898 Other specified noninflammatory disorders of vagina: Secondary | ICD-10-CM | POA: Diagnosis not present

## 2020-04-23 LAB — POCT WET PREP (WET MOUNT)
Clue Cells Wet Prep Whiff POC: NEGATIVE
Trichomonas Wet Prep HPF POC: ABSENT

## 2020-04-23 MED ORDER — LEVOCETIRIZINE DIHYDROCHLORIDE 5 MG PO TABS: 5.0000 mg | ORAL_TABLET | Freq: Every evening | ORAL | 0 refills | Status: DC

## 2020-04-23 MED ORDER — FLUCONAZOLE 150 MG PO TABS: 150.0000 mg | ORAL_TABLET | Freq: Once | ORAL | 0 refills | Status: AC

## 2020-04-23 NOTE — Patient Instructions (Signed)
Your vaginal discharge could possibly be a very early yeast infection. Since you aren't really having much symptoms at this point, I would hold off on taking the medication. I'm sending in a prescription for 1 tablet of diflucan--take this if you become more symptomatic--itching, worsening vaginal discharge (thick, white). It can take a few days to have its full effect (up to a week).   Vaginal Yeast Infection, Adult  Vaginal yeast infection is a condition that causes vaginal discharge as well as soreness, swelling, and redness (inflammation) of the vagina. This is a common condition. Some women get this infection frequently. What are the causes? This condition is caused by a change in the normal balance of the yeast (candida) and bacteria that live in the vagina. This change causes an overgrowth of yeast, which causes the inflammation. What increases the risk? The condition is more likely to develop in women who:  Take antibiotic medicines.  Have diabetes.  Take birth control pills.  Are pregnant.  Douche often.  Have a weak body defense system (immune system).  Have been taking steroid medicines for a long time.  Frequently wear tight clothing. What are the signs or symptoms? Symptoms of this condition include:  White, thick, creamy vaginal discharge.  Swelling, itching, redness, and irritation of the vagina. The lips of the vagina (vulva) may be affected as well.  Pain or a burning feeling while urinating.  Pain during sex. How is this diagnosed? This condition is diagnosed based on:  Your medical history.  A physical exam.  A pelvic exam. Your health care provider will examine a sample of your vaginal discharge under a microscope. Your health care provider may send this sample for testing to confirm the diagnosis. How is this treated? This condition is treated with medicine. Medicines may be over-the-counter or prescription. You may be told to use one or more of  the following:  Medicine that is taken by mouth (orally).  Medicine that is applied as a cream (topically).  Medicine that is inserted directly into the vagina (suppository). Follow these instructions at home:  Lifestyle  Do not have sex until your health care provider approves. Tell your sex partner that you have a yeast infection. That person should go to his or her health care provider and ask if they should also be treated.  Do not wear tight clothes, such as pantyhose or tight pants.  Wear breathable cotton underwear. General instructions  Take or apply over-the-counter and prescription medicines only as told by your health care provider.  Eat more yogurt. This may help to keep your yeast infection from returning.  Do not use tampons until your health care provider approves.  Try taking a sitz bath to help with discomfort. This is a warm water bath that is taken while you are sitting down. The water should only come up to your hips and should cover your buttocks. Do this 3-4 times per day or as told by your health care provider.  Do not douche.  If you have diabetes, keep your blood sugar levels under control.  Keep all follow-up visits as told by your health care provider. This is important. Contact a health care provider if:  You have a fever.  Your symptoms go away and then return.  Your symptoms do not get better with treatment.  Your symptoms get worse.  You have new symptoms.  You develop blisters in or around your vagina.  You have blood coming from your vagina and it  is not your menstrual period.  You develop pain in your abdomen. Summary  Vaginal yeast infection is a condition that causes discharge as well as soreness, swelling, and redness (inflammation) of the vagina.  This condition is treated with medicine. Medicines may be over-the-counter or prescription.  Take or apply over-the-counter and prescription medicines only as told by your health  care provider.  Do not douche. Do not have sex or use tampons until your health care provider approves.  Contact a health care provider if your symptoms do not get better with treatment or your symptoms go away and then return. This information is not intended to replace advice given to you by your health care provider. Make sure you discuss any questions you have with your health care provider. Document Revised: 03/29/2019 Document Reviewed: 01/15/2018 Elsevier Patient Education  [place].

## 2020-04-23 NOTE — Telephone Encounter (Signed)
rx sent and called patient-she scheduled CPE for 05/19/20 @ 3:15pm.

## 2020-04-23 NOTE — Telephone Encounter (Signed)
Ok to refill as long she schedules

## 2020-04-23 NOTE — Telephone Encounter (Signed)
Patient needs refill on xyzal #90 to [facility] on [address]. She is seeing Dr. Tomi Bamberger this morning, I advised her to schedule a CPE at checkout today as she is due in Sept.

## 2020-05-19 ENCOUNTER — Encounter: Payer: 59 | Admitting: Family Medicine

## 2020-08-12 ENCOUNTER — Encounter: Payer: 59 | Admitting: Women's Health

## 2021-03-03 IMAGING — MG DIGITAL SCREENING BILAT W/ TOMO W/ CAD
8 series · 8 of 24 positions shown · non-contrast
Comparison: No prior films

CLINICAL DATA: Screening.

EXAM:
DIGITAL SCREENING BILATERAL MAMMOGRAM WITH TOMO AND CAD

[L MLO synth-2D]
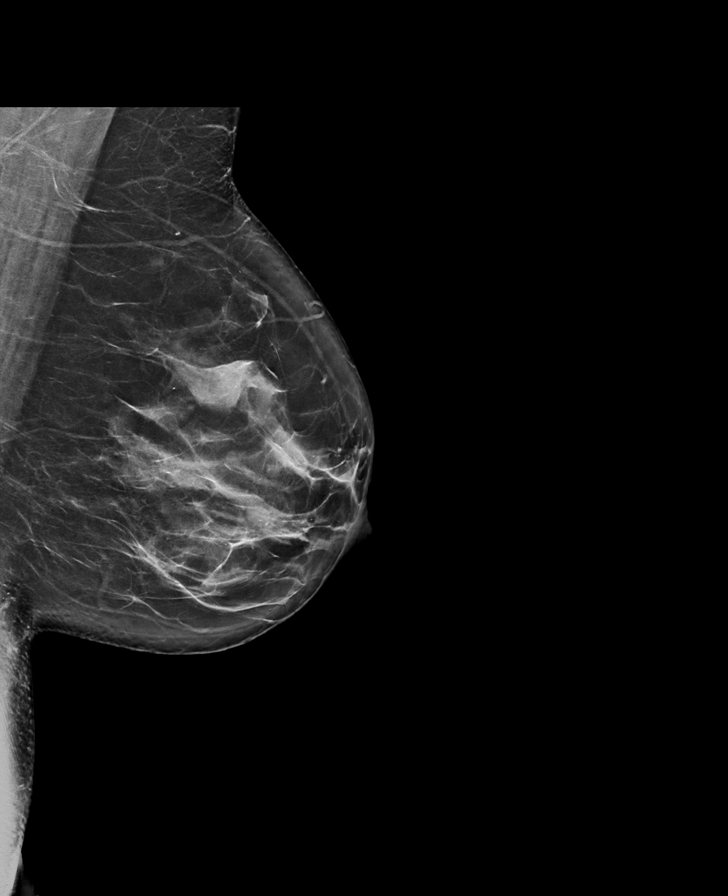

[R MLO synth-2D]
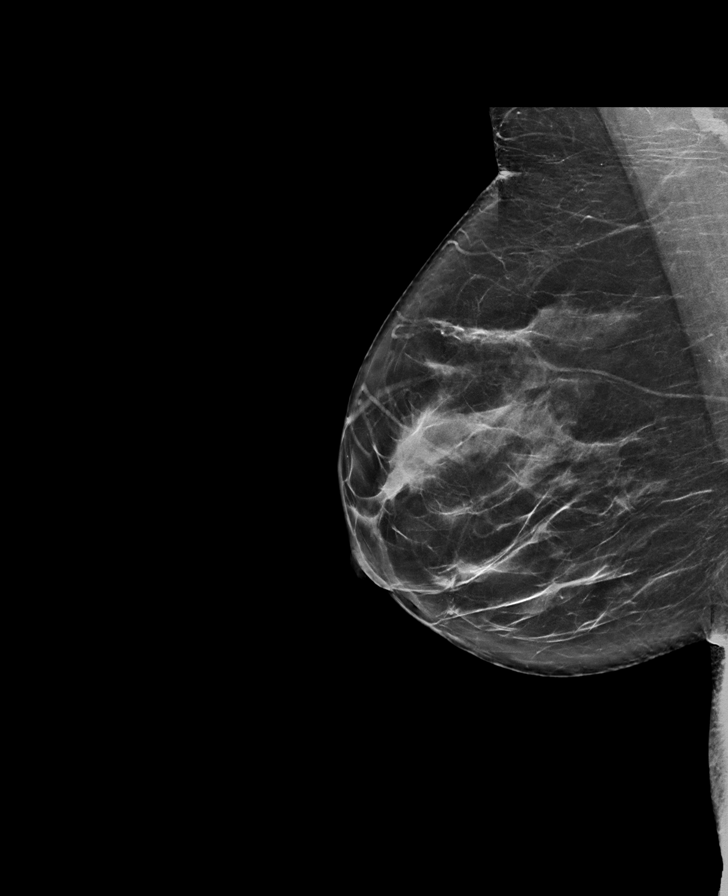

[L CC synth-2D]
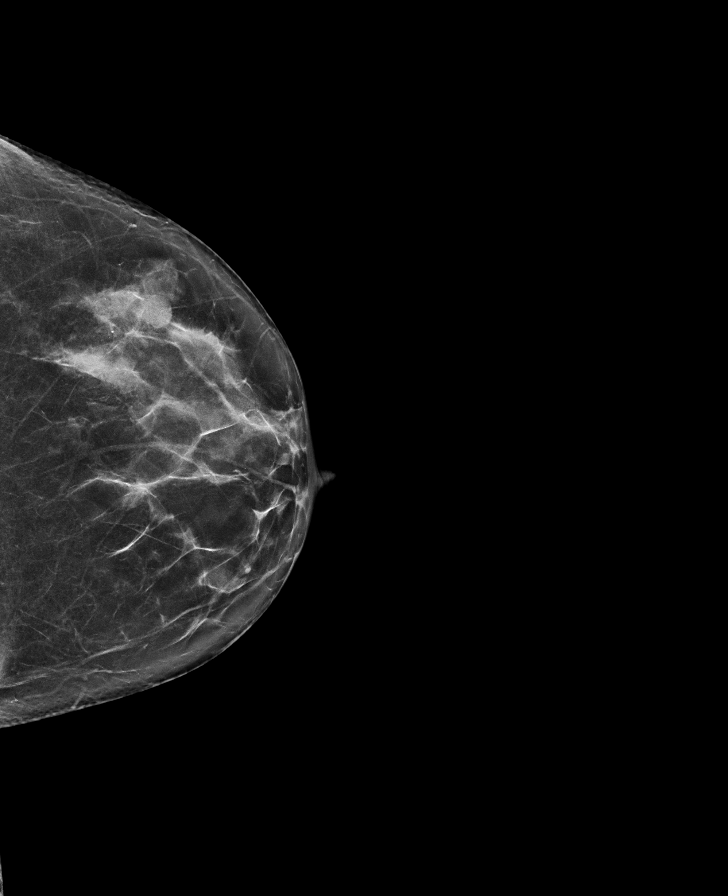

[R CC synth-2D]
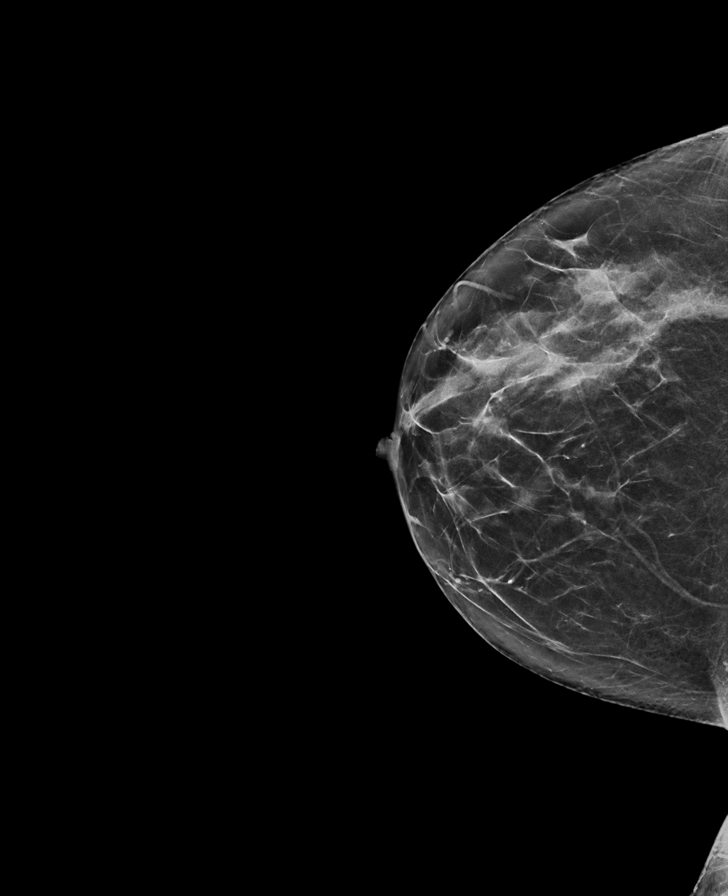

[L CC tomo · tomo slice 35/70.0]
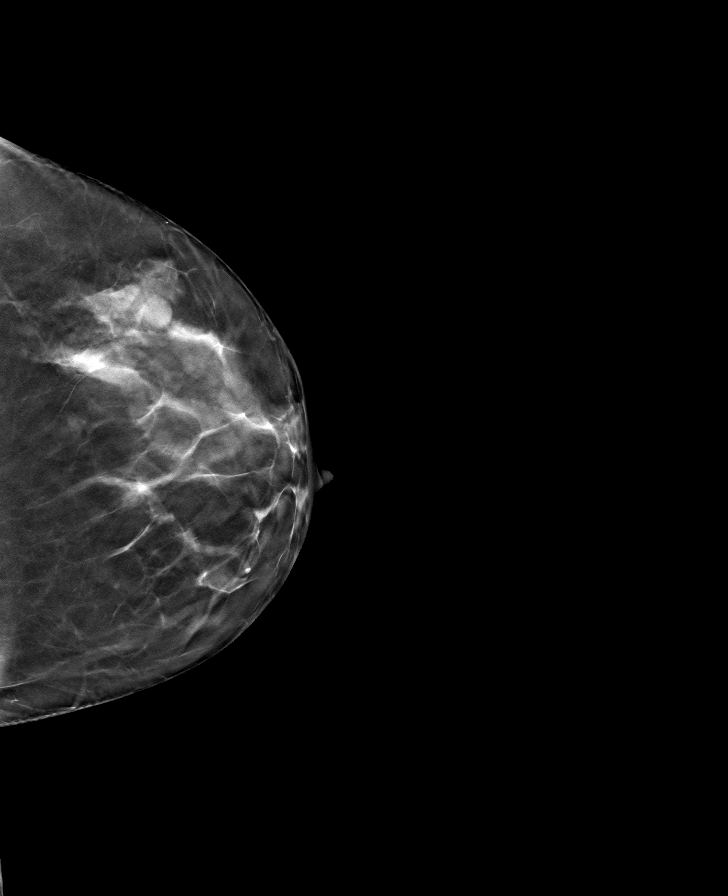

[L MLO tomo · tomo slice 41/82.0]
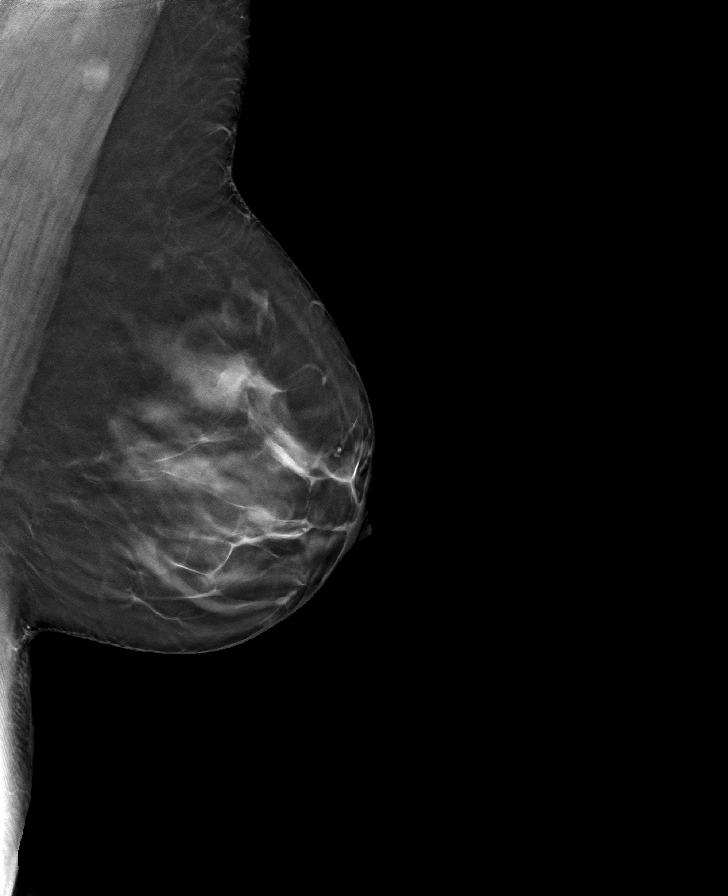

[R CC tomo · tomo slice 35/69.0]
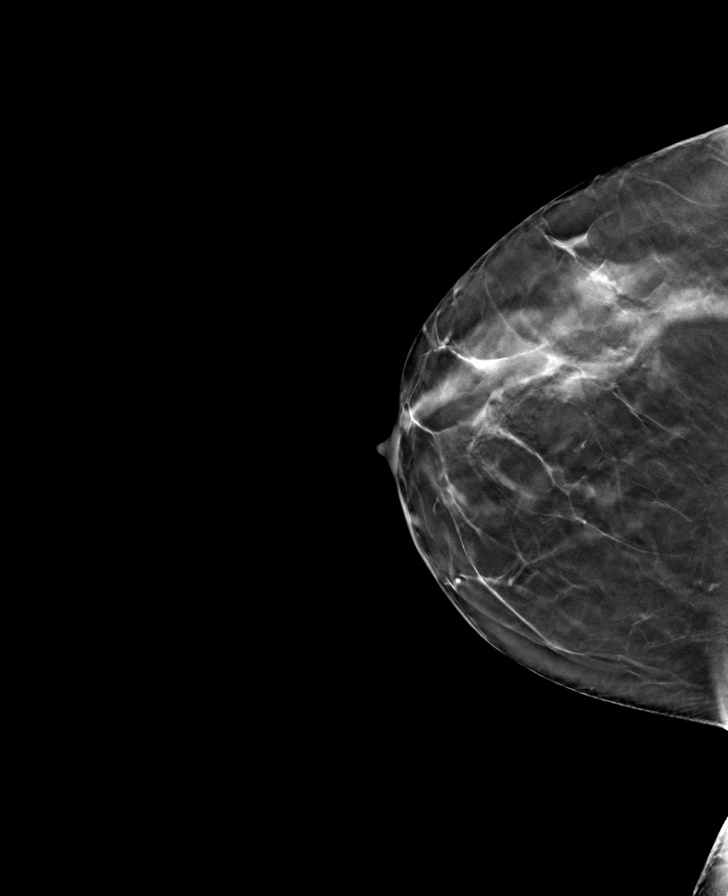

[R MLO tomo · tomo slice 41/82.0]
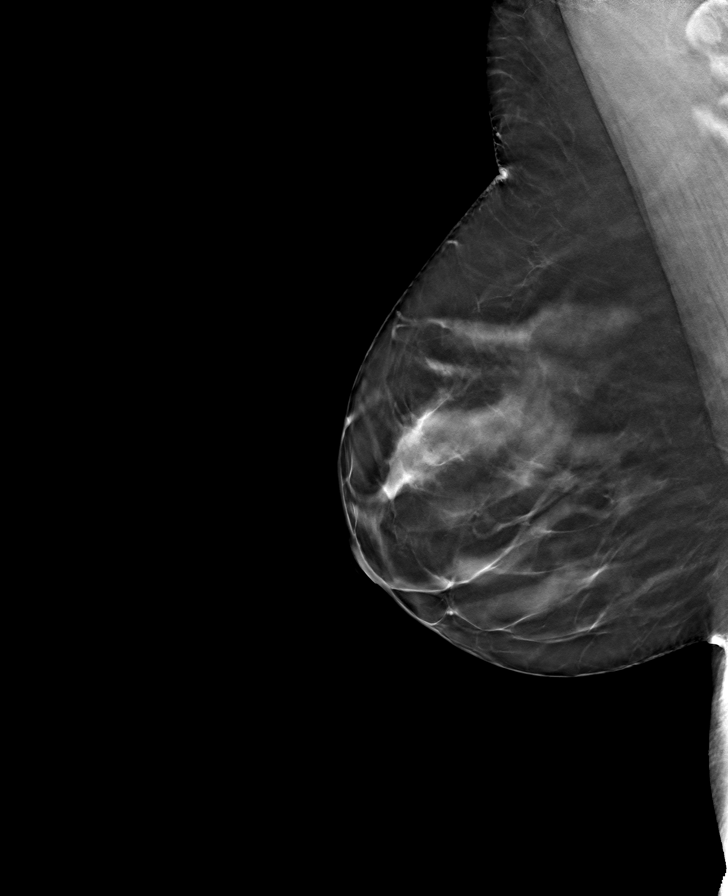

[8 of 24 positions shown; findings below may reference images not displayed]

ACR Breast Density Category c: The breast tissue is heterogeneously
dense, which may obscure small masses.
FINDINGS: In the left breast, a possible mass warrants further evaluation. In
the right breast, no findings suspicious for malignancy. Images were
processed with CAD.
IMPRESSION: Further evaluation is suggested for possible mass in the left
breast.

RECOMMENDATION:
Ultrasound of the left breast. (Code:IM-3-66E)

The patient will be contacted regarding the findings, and additional
imaging will be scheduled.

BI-RADS CATEGORY  0: Incomplete. Need additional imaging evaluation
and/or prior mammograms for comparison.

## 2022-02-13 ENCOUNTER — Encounter: Payer: Self-pay | Admitting: Adult Health

## 2022-02-13 ENCOUNTER — Other Ambulatory Visit: Payer: Self-pay

## 2022-02-13 ENCOUNTER — Encounter (HOSPITAL_BASED_OUTPATIENT_CLINIC_OR_DEPARTMENT_OTHER): Payer: Self-pay | Admitting: Emergency Medicine

## 2022-02-13 ENCOUNTER — Emergency Department (HOSPITAL_BASED_OUTPATIENT_CLINIC_OR_DEPARTMENT_OTHER)
Admission: EM | Admit: 2022-02-13 | Discharge: 2022-02-13 | Disposition: A | Discharge status: Home / Self Care | Payer: Managed Care, Other (non HMO) | Attending: Emergency Medicine | Admitting: Emergency Medicine

## 2022-02-13 ENCOUNTER — Emergency Department (HOSPITAL_BASED_OUTPATIENT_CLINIC_OR_DEPARTMENT_OTHER): Payer: Managed Care, Other (non HMO)

## 2022-02-13 DIAGNOSIS — N83202 Unspecified ovarian cyst, left side: Secondary | ICD-10-CM | POA: Diagnosis not present

## 2022-02-13 DIAGNOSIS — R1032 Left lower quadrant pain: Secondary | ICD-10-CM | POA: Diagnosis present

## 2022-02-13 LAB — URINALYSIS, ROUTINE W REFLEX MICROSCOPIC
Bilirubin Urine: NEGATIVE
Glucose, UA: NEGATIVE mg/dL
Ketones, ur: NEGATIVE mg/dL
Leukocytes,Ua: NEGATIVE
Nitrite: NEGATIVE
Protein, ur: NEGATIVE mg/dL
Specific Gravity, Urine: 1.02 (ref 1.005–1.030)
pH: 7 (ref 5.0–8.0)

## 2022-02-13 LAB — COMPREHENSIVE METABOLIC PANEL
ALT: 15 U/L (ref 0–44)
AST: 14 U/L — ABNORMAL LOW (ref 15–41)
Albumin: 3.7 g/dL (ref 3.5–5.0)
Alkaline Phosphatase: 57 U/L (ref 38–126)
Anion gap: 6 (ref 5–15)
BUN: 10 mg/dL (ref 6–20)
CO2: 25 mmol/L (ref 22–32)
Calcium: 8.8 mg/dL — ABNORMAL LOW (ref 8.9–10.3)
Chloride: 104 mmol/L (ref 98–111)
Creatinine, Ser: 0.69 mg/dL (ref 0.44–1.00)
GFR, Estimated: >60 mL/min (ref >60)
Glucose, Bld: 94 mg/dL (ref 70–99)
Potassium: 4.1 mmol/L (ref 3.5–5.1)
Sodium: 135 mmol/L (ref 135–145)
Total Bilirubin: 0.6 mg/dL (ref 0.3–1.2)
Total Protein: 8.5 g/dL — ABNORMAL HIGH (ref 6.5–8.1)

## 2022-02-13 LAB — CBC
HCT: 38.8 % (ref 36.0–46.0)
Hemoglobin: 12.8 g/dL (ref 12.0–15.0)
MCH: 28.1 pg (ref 26.0–34.0)
MCHC: 33 g/dL (ref 30.0–36.0)
MCV: 85.1 fL (ref 80.0–100.0)
Platelets: 435 10*3/uL — ABNORMAL HIGH (ref 150–400)
RBC: 4.56 MIL/uL (ref 3.87–5.11)
RDW: 15.5 % (ref 11.5–15.5)
WBC: 8.8 10*3/uL (ref 4.0–10.5)
nRBC: 0 % (ref 0.0–0.2)

## 2022-02-13 LAB — LIPASE, BLOOD: Lipase: 31 U/L (ref 11–51)

## 2022-02-13 LAB — URINALYSIS, MICROSCOPIC (REFLEX)

## 2022-02-13 MED ORDER — IOHEXOL 300 MG/ML  SOLN
100.0000 mL | Freq: Once | INTRAMUSCULAR | Status: AC | PRN
Start: 2022-02-13 — End: 2022-02-13
  Administered 2022-02-13: 100 mL via INTRAVENOUS

## 2022-02-13 MED ORDER — IBUPROFEN 800 MG PO TABS: 800.0000 mg | ORAL_TABLET | Freq: Three times a day (TID) | ORAL | 0 refills | Status: DC | PRN

## 2022-02-13 MED ORDER — KETOROLAC TROMETHAMINE 15 MG/ML IJ SOLN
15.0000 mg | Freq: Once | INTRAMUSCULAR | Status: AC
Start: 2022-02-13 — End: 2022-02-13
  Administered 2022-02-13: 15 mg via INTRAVENOUS
  Filled 2022-02-13: qty 1

## 2022-02-13 MED ORDER — HYDROCODONE-ACETAMINOPHEN 5-325 MG PO TABS
1.0000 | ORAL_TABLET | Freq: Once | ORAL | Status: AC
  Administered 2022-02-13: 1 via ORAL
  Filled 2022-02-13: qty 1

## 2022-02-13 NOTE — ED Provider Notes (Addendum)
[facility] EMERGENCY DEPARTMENT Provider Note   CSN: [id number] Arrival date & time: 02/13/22  1319     History  Chief Complaint  Patient presents with   Abdominal Pain    Brandi Durham is a 46 y.o. female.  Patient is a 46 year old female with no significant past medical history presenting for complaints of abdominal pain.  Patient has a left lower quad abdominal pain and, nonradiating, severe, constant, x5 days.  Patient denies any fevers, chills, nausea, vomiting, diarrhea.  Denies any vaginal pain, pelvic pain, vaginal discharge, or vaginal bleeding.  Denies any dysuria, increased frequency, urgency, or hematuria.  The history is provided by the patient. No language interpreter was used.  Abdominal Pain Associated symptoms: no chest pain, no chills, no cough, no diarrhea, no dysuria, no fever, no hematuria, no nausea, no shortness of breath, no sore throat, no vaginal bleeding, no vaginal discharge and no vomiting       Home Medications Prior to Admission medications   Medication Sig Start Date End Date Taking? Authorizing Provider  ibuprofen (ADVIL) 800 MG tablet Take 1 tablet (800 mg total) by mouth every 8 (eight) hours as needed for moderate pain. 04/19/81  Yes Campbell Stall P, DO  ascorbic acid (VITAMIN C) 500 MG tablet Take 500 mg by mouth daily.    [provider]  levocetirizine (XYZAL) 5 MG tablet Take 1 tablet (5 mg total) by mouth every evening. 04/23/20   Henson, Vickie L, NP-C  Polyethylene Glycol 3350 (MIRALAX PO) Take by mouth. Patient not taking: Reported on 04/23/2020    [provider]      Allergies    Doxycycline, Elemental sulfur, and Penicillins    Review of Systems   Review of Systems  Constitutional:  Negative for chills and fever.  HENT:  Negative for ear pain and sore throat.   Eyes:  Negative for pain and visual disturbance.  Respiratory:  Negative for cough and shortness of breath.   Cardiovascular:  Negative for  chest pain and palpitations.  Gastrointestinal:  Positive for abdominal pain. Negative for diarrhea, nausea and vomiting.  Genitourinary:  Negative for decreased urine volume, dysuria, hematuria, pelvic pain, urgency, vaginal bleeding, vaginal discharge and vaginal pain.  Musculoskeletal:  Negative for arthralgias and back pain.  Skin:  Negative for color change and rash.  Neurological:  Negative for seizures and syncope.  All other systems reviewed and are negative.  Physical Exam Updated Vital Signs BP 106/67   Pulse 64   Temp 98.4 F (36.9 C) (Oral)   Resp 16   Ht '5\' 2"'$  (1.575 m)   Wt 77.1 kg   LMP 11/15/2019   SpO2 97%   BMI 31.09 kg/m  Physical Exam Vitals and nursing note reviewed.  Constitutional:      General: She is not in acute distress.    Appearance: She is well-developed.  HENT:     Head: Normocephalic and atraumatic.  Eyes:     Conjunctiva/sclera: Conjunctivae normal.  Cardiovascular:     Rate and Rhythm: Normal rate and regular rhythm.     Heart sounds: No murmur heard. Pulmonary:     Effort: Pulmonary effort is normal. No respiratory distress.     Breath sounds: Normal breath sounds.  Abdominal:     Palpations: Abdomen is soft.     Tenderness: There is abdominal tenderness in the left lower quadrant. There is guarding. There is no rebound.  Musculoskeletal:        General:  No swelling.     Cervical back: Neck supple.  Skin:    General: Skin is warm and dry.     Capillary Refill: Capillary refill takes less than 2 seconds.  Neurological:     Mental Status: She is alert.  Psychiatric:        Mood and Affect: Mood normal.    ED Results / Procedures / Treatments   Labs (all labs ordered are listed, but only abnormal results are displayed) Labs Reviewed  COMPREHENSIVE METABOLIC PANEL - Abnormal; Notable for the following components:      Result Value   Calcium 8.8 (*)    Total Protein 8.5 (*)    AST 14 (*)    All other components within normal  limits  CBC - Abnormal; Notable for the following components:   Platelets 435 (*)    All other components within normal limits  URINALYSIS, ROUTINE W REFLEX MICROSCOPIC - Abnormal; Notable for the following components:   Hgb urine dipstick TRACE (*)    All other components within normal limits  URINALYSIS, MICROSCOPIC (REFLEX) - Abnormal; Notable for the following components:   Bacteria, UA MANY (*)    All other components within normal limits  LIPASE, BLOOD    EKG None  Radiology CT ABDOMEN PELVIS W CONTRAST  Result Date: 02/13/2022 CLINICAL DATA:  Left lower quadrant abdominal pain. Pain since Wednesday. EXAM: CT ABDOMEN AND PELVIS WITH CONTRAST TECHNIQUE: Multidetector CT imaging of the abdomen and pelvis was performed using the standard protocol following bolus administration of intravenous contrast. RADIATION DOSE REDUCTION: This exam was performed according to the departmental dose-optimization program which includes automated exposure control, adjustment of the mA and/or kV according to patient size and/or use of iterative reconstruction technique. CONTRAST:  153m OMNIPAQUE IOHEXOL 300 MG/ML  SOLN COMPARISON:  None Available. FINDINGS: Lower chest: Minor subsegmental atelectasis at the right lung base. No pleural effusion. Normal heart size. Hepatobiliary: No focal liver abnormality is seen. No gallstones, gallbladder wall thickening, or biliary dilatation. Pancreas: Unremarkable. No pancreatic ductal dilatation or surrounding inflammatory changes. Spleen: Normal in size without focal abnormality. Adrenals/Urinary Tract: Normal adrenal glands. No hydronephrosis or perinephric edema. Homogeneous renal enhancement. No focal renal abnormality or stone. Urinary bladder is physiologically distended without wall thickening. Stomach/Bowel: Tiny hiatal hernia stomach is otherwise unremarkable. No small bowel obstruction or inflammation. Appendix not visualized, appendectomy per history. Moderate  volume of stool in the colon. Occasional colonic diverticula, without diverticulitis. No colonic wall thickening or pericolonic edema. Vascular/Lymphatic: Normal caliber abdominal aorta. Patent portal vein. No acute vascular findings. No abdominopelvic adenopathy. Reproductive: Post hysterectomy. There is a 2.5 cm left ovarian cyst measuring simple fluid density. No adjacent fat stranding or inflammation. Quiescent appearance of the right ovary. No adnexal mass. Other: No free air, free fluid, or intra-abdominal fluid collection. No abdominal wall hernia Musculoskeletal: There are no acute or suspicious osseous abnormalities. L5-S1 degenerative disc disease. IMPRESSION: 1. No acute abnormality or explanation for abdominal pain. 2. Minimal colonic diverticulosis without diverticulitis. 3. Left ovarian cyst measuring 2.5 cm, likely physiologic. Consensus guidelines recommend no follow-up imaging. Note: This recommendation does not apply to premenarchal patients and to those with increased risk (genetic, family history, elevated tumor markers or other high-risk factors) of ovarian cancer. If left lower quadrant symptoms are felt to be related to ovarian pathology, consider pelvic ultrasound for assessment. Electronically Signed   By: MKeith RakeM.D.   On: 02/13/2022 17:06   [place]PELVIC COMPLETE WITH TRANSVAGINAL  Result Date: 02/13/2022 CLINICAL DATA:  Left lower quadrant pain EXAM: TRANSABDOMINAL AND TRANSVAGINAL ULTRASOUND OF PELVIS DOPPLER ULTRASOUND OF OVARIES TECHNIQUE: Both transabdominal and transvaginal ultrasound examinations of the pelvis were performed. Transabdominal technique was performed for global imaging of the pelvis including uterus, ovaries, adnexal regions, and pelvic cul-de-sac. It was necessary to proceed with endovaginal exam following the transabdominal exam to visualize the endometrium, ovaries, and adnexa. Color and duplex Doppler ultrasound was utilized to evaluate blood flow to the  ovaries. COMPARISON:  Ultrasound 10/03/2019, CT abdomen pelvis 02/13/2022 FINDINGS: Uterus Status post hysterectomy. Right ovary Could not be visualized due to overlying bowel gas. Left ovary Measurements: 4.0 x 2.1 x 2.6 cm = volume: 12.2 mL. A cyst is noted within the left ovary, which measures 2.1 x 1.8 x 2.2 cm which appears simple. Otherwise normal appearance. Pulsed Doppler evaluation of the left ovary normal low-resistance arterial and venous waveforms. Other findings No abnormal free fluid. IMPRESSION: 1. Simple appearing cyst in the left ovary measuring up to 2.2 cm. The left ovary is otherwise unremarkable. No followup imaging recommended. Note: This recommendation does not apply to premenarchal patients or to those with increased risk (genetic, family history, elevated tumor markers or other high-risk factors) of ovarian cancer. Reference: Radiology 2019 Nov; 293(2):359-371. 2. The right ovary could not be visualized due to overlying bowel gas. 3. Status post hysterectomy. Electronically Signed   By: Merilyn Baba M.D.   On: 02/13/2022 18:25    Procedures Procedures    Medications Ordered in ED Medications  ketorolac (TORADOL) 15 MG/ML injection 15 mg (15 mg Intravenous Given 02/13/22 1633)  HYDROcodone-acetaminophen (NORCO/VICODIN) 5-325 MG per tablet 1 tablet (1 tablet Oral Given 02/13/22 1633)  iohexol (OMNIPAQUE) 300 MG/ML solution 100 mL (100 mLs Intravenous Contrast Given 02/13/22 1647)    ED Course/ Medical Decision Making/ A&P                           Medical Decision Making Amount and/or Complexity of Data Reviewed Labs: ordered. Radiology: ordered.  Risk Prescription drug management.   89:52 PM 46 year old female with no significant past medical history presenting fo as alert and oriented x3, no acute distress, afebrile, stable vital signs.  Physical exam demonstrates soft abdomen with tenderness palpation in left lower quadrant with guarding.  No rigidity.  Pharyngeal  diagnosis included but not limited to diverticulitis, colitis, ovarian pathology, or splenomegaly.  Laboratory studies demonstrate concerning findings for sepsis.  Stable liver profile, lipase, and renal function.  No leukocytosis.  Scan pending.  CT abdomen pelvis with contrast demonstrates no diverticulitis.  No splenomegaly.  Concerning for left-sided ovarian cyst.  Transvaginal ultrasound demonstrates no torsion.  Patient has had a hysterectomy.  Left-sided ovarian cyst present.  Recommended for close follow-up with OB/GYN if symptoms continue in the next 3 to 5 days.  Recommended for Motrin.  Work note provided for today.  Patient in no distress and overall condition improved here in the ED. Detailed discussions were had with the patient regarding current findings, and need for close f/u with PCP or on call doctor. The patient has been instructed to return immediately if the symptoms worsen in any way for re-evaluation. Patient verbalized understanding and is in agreement with current care plan. All questions answered prior to discharge.          Final Clinical Impression(s) / ED Diagnoses Final diagnoses:  Cyst of left ovary    Rx / DC  Orders ED Discharge Orders          Ordered    ibuprofen (ADVIL) 800 MG tablet  Every 8 hours PRN        02/13/22 1901              Lianne Cure, DO 29/03/79 [id number]    Lianne Cure, DO 16/74/25 1902

## 2022-02-13 NOTE — ED Notes (Signed)
Pt transported to US

## 2022-02-13 NOTE — ED Triage Notes (Signed)
Pt c/o left lower abdominal pain. Onset Wednesday. Pt denies N/V/D.

## 2022-02-15 ENCOUNTER — Ambulatory Visit (INDEPENDENT_AMBULATORY_CARE_PROVIDER_SITE_OTHER): Payer: Managed Care, Other (non HMO) | Admitting: Obstetrics & Gynecology

## 2022-02-15 ENCOUNTER — Encounter: Payer: Self-pay | Admitting: Obstetrics & Gynecology

## 2022-02-15 VITALS — BP 116/74 | Temp 98.6°F

## 2022-02-15 DIAGNOSIS — R109 Unspecified abdominal pain: Secondary | ICD-10-CM | POA: Diagnosis not present

## 2022-02-15 NOTE — Progress Notes (Signed)
    Brandi Durham Mar 27, 1976 [id number]        46 y.o.  [id number]   RP: Left progressively worsening abdominal pain x 6 days  HPI: Worsening left abdominal pain x 6 days.  Left upper and lower abdomen feels more and more tender, painful and bloated x Sunday when she was evaluated at the ER.  Patient is not able to eat for the last 3-4 days as it increases her bloated sensation.  Only having water or gatorade.  Her last BM was last night, on the softer/diarrhea side.  No vomiting.  No fever.  H/O Appendectomy for Ruptured appendix.  On CT scan 02/13/22, Diverticulosis with no evidence of Diverticulitis, no bowel wall inflammation seen, no evidence of bowel obstruction.  On Pelvic US 02/13/22, Small 2.5 cm simple Lt Ovarian Cyst.  No FF in the pelvis.   OB History  Gravida Para Term Preterm AB Living  '2 1   1 1 2  '$ SAB IAB Ectopic Multiple Live Births      1 1      # Outcome Date GA Lbr Len/2nd Weight Sex Delivery Anes PTL Lv  2 Ectopic           1 Preterm             Past medical history,surgical history, problem list, medications, allergies, family history and social history were all reviewed and documented in the EPIC chart.   Directed ROS with pertinent positives and negatives documented in the history of present illness/assessment and plan.  Exam:  Vitals:   02/15/22 1118  BP: 116/74  Temp: 98.6 F (37 C)  TempSrc: Oral   General appearance:  Normal  Abdomen: Increased BS, some high pitch sounds in the left abdomen, but not very hyperactive.  No rigid abdomen.  Tender to palpation throughout, mildly bloated.  Gynecologic exam: Vulva normal.  Bimanual exam:  S/P Total Hysterectomy.  No adnexal mass felt, NT with vaginal hand, but tender at the left abdominal hand.  CBC 02/13/22:  WBC normal at 8.8                       Hb normal at 12.8   Assessment/Plan:  46 y.o. [name]   1. Left sided abdominal pain  Worsening left abdominal pain x 6 days.  Left upper and lower abdomen  feels more and more tender, painful and bloated x Sunday when she was evaluated at the ER.  Patient is not able to eat for the last 3-4 days as it increases her bloated sensation.  Only having water or gatorade.  Her last BM was last night, on the softer/diarrhea side.  No vomiting.  No fever.  H/O Appendectomy for Ruptured appendix.  On CT scan 02/13/22, Diverticulosis with no evidence of Diverticulitis, no bowel wall inflammation seen, no evidence of bowel obstruction.  On Pelvic US 02/13/22, Small 2.5 cm simple Lt Ovarian Cyst.  No FF in the pelvis.  Probable early partial bowel obstruction or diverticulitis.  No evidence of Gynecologic origin of the pain.  Urgent referral to General Surgery.  Recommendation to present to the ER promptly if worsening Sx before the evaluation by General Surgery.  [name] MD, 11:56 AM 02/15/2022

## 2022-02-17 ENCOUNTER — Ambulatory Visit: Payer: Self-pay | Admitting: Obstetrics & Gynecology

## 2022-02-17 ENCOUNTER — Telehealth: Payer: Self-pay | Admitting: *Deleted

## 2022-02-17 DIAGNOSIS — R109 Unspecified abdominal pain: Secondary | ICD-10-CM

## 2022-02-17 DIAGNOSIS — R140 Abdominal distension (gaseous): Secondary | ICD-10-CM

## 2022-02-17 NOTE — Telephone Encounter (Signed)
Dr.Laovie replied "Refer to [facility] then and tell patient to present to ER if worsening symptoms. "   Dr. Dellis Filbert asked if you would provide her with a note to return to work on 02/21/22 she is scheduled to work this weekend? She has a job that requires standing and walking. Referral placed at [facility] GI number given to patient to call to scheduled.   Are you okay providing a letter? Please advise

## 2022-02-17 NOTE — Telephone Encounter (Signed)
Dr.Lavoie I sent a staff message referral coordinator regarding referral and she replied back.  Good morning Anderson Malta,   I have spoken with a couple of the surgeons this morning and with them looking at the CT, the pt just had, they see no signs that she would need to be seen by a surgeon.   Lattie Haw

## 2022-02-17 NOTE — Telephone Encounter (Signed)
See the below the referral coordinator did say that the triage nurses said GI would be a good start.

## 2022-02-17 NOTE — Telephone Encounter (Signed)
-----   Message from [name], MD sent at 02/16/2022  3:24 PM EDT ----- Regarding: Urgent referral to General Surgery Left abdominal pain, bloated, not able to eat any solid food.  Increased BS on the left.  Pelvic US with normal uterus and ovaries, no FF.  Partial Bowel Obstruction? Diverticulitis?

## 2022-02-21 ENCOUNTER — Encounter: Payer: Self-pay | Admitting: Gastroenterology

## 2022-02-23 ENCOUNTER — Encounter: Payer: Self-pay | Admitting: Obstetrics & Gynecology

## 2022-02-24 NOTE — Telephone Encounter (Signed)
Patient scheduled on 03/02/22 with Mansouraty, Telford Nab., MD at [place].

## 2022-02-25 ENCOUNTER — Encounter: Payer: Self-pay | Admitting: *Deleted

## 2022-03-02 ENCOUNTER — Ambulatory Visit (INDEPENDENT_AMBULATORY_CARE_PROVIDER_SITE_OTHER): Payer: Managed Care, Other (non HMO) | Admitting: Gastroenterology

## 2022-03-02 ENCOUNTER — Other Ambulatory Visit (INDEPENDENT_AMBULATORY_CARE_PROVIDER_SITE_OTHER): Payer: Managed Care, Other (non HMO)

## 2022-03-02 ENCOUNTER — Encounter: Payer: Self-pay | Admitting: Gastroenterology

## 2022-03-02 VITALS — BP 112/78 | HR 82 | Ht 62.0 in | Wt 172.0 lb

## 2022-03-02 DIAGNOSIS — R1032 Left lower quadrant pain: Secondary | ICD-10-CM | POA: Diagnosis not present

## 2022-03-02 DIAGNOSIS — Z1211 Encounter for screening for malignant neoplasm of colon: Secondary | ICD-10-CM

## 2022-03-02 DIAGNOSIS — R634 Abnormal weight loss: Secondary | ICD-10-CM

## 2022-03-02 DIAGNOSIS — R109 Unspecified abdominal pain: Secondary | ICD-10-CM

## 2022-03-02 DIAGNOSIS — R12 Heartburn: Secondary | ICD-10-CM | POA: Diagnosis not present

## 2022-03-02 DIAGNOSIS — R1012 Left upper quadrant pain: Secondary | ICD-10-CM | POA: Diagnosis not present

## 2022-03-02 DIAGNOSIS — R110 Nausea: Secondary | ICD-10-CM

## 2022-03-02 DIAGNOSIS — R194 Change in bowel habit: Secondary | ICD-10-CM

## 2022-03-02 LAB — AMYLASE: Amylase: 39 U/L (ref 27–131)

## 2022-03-02 LAB — COMPREHENSIVE METABOLIC PANEL
ALT: 9 U/L (ref 0–35)
AST: 10 U/L (ref 0–37)
Albumin: 3.9 g/dL (ref 3.5–5.2)
Alkaline Phosphatase: 54 U/L (ref 39–117)
BUN: 12 mg/dL (ref 6–23)
CO2: 27 mEq/L (ref 19–32)
Calcium: 9.1 mg/dL (ref 8.4–10.5)
Chloride: 102 mEq/L (ref 96–112)
Creatinine, Ser: 0.77 mg/dL (ref 0.40–1.20)
GFR: 91.35 mL/min (ref >60.00)
Glucose, Bld: 90 mg/dL (ref 70–99)
Potassium: 3.9 mEq/L (ref 3.5–5.1)
Sodium: 135 mEq/L (ref 135–145)
Total Bilirubin: 0.3 mg/dL (ref 0.2–1.2)
Total Protein: 7.8 g/dL (ref 6.0–8.3)

## 2022-03-02 LAB — CBC
HCT: 38.8 % (ref 36.0–46.0)
Hemoglobin: 12.9 g/dL (ref 12.0–15.0)
MCHC: 33.2 g/dL (ref 30.0–36.0)
MCV: 84.2 fl (ref 78.0–100.0)
Platelets: 410 10*3/uL — ABNORMAL HIGH (ref 150.0–400.0)
RBC: 4.61 Mil/uL (ref 3.87–5.11)
RDW: 15.3 % (ref 11.5–15.5)
WBC: 10.4 10*3/uL (ref 4.0–10.5)

## 2022-03-02 LAB — CORTISOL: Cortisol, Plasma: 5.4 ug/dL

## 2022-03-02 LAB — LIPASE: Lipase: 30 U/L (ref 11.0–59.0)

## 2022-03-02 LAB — C-REACTIVE PROTEIN: CRP: 1.9 mg/dL (ref 0.5–20.0)

## 2022-03-02 LAB — SEDIMENTATION RATE: Sed Rate: 31 mm/hr — ABNORMAL HIGH (ref 0–20)

## 2022-03-02 LAB — TSH: TSH: 1.11 u[IU]/mL (ref 0.35–5.50)

## 2022-03-02 MED ORDER — ESOMEPRAZOLE MAGNESIUM 40 MG PO CPDR: 40.0000 mg | DELAYED_RELEASE_CAPSULE | Freq: Every day | ORAL | 2 refills | Status: DC

## 2022-03-02 MED ORDER — NA SULFATE-K SULFATE-MG SULF 17.5-3.13-1.6 GM/177ML PO SOLN: 1.0000 | ORAL | 0 refills | Status: DC

## 2022-03-02 NOTE — Patient Instructions (Addendum)
Your provider has requested that you go to the basement level for lab work before leaving today. Press "B" on the elevator. The lab is located at the first door on the left as you exit the elevator.  You have been scheduled for a CT scan of the abdomen and pelvis at [facility], 1st floor Radiology. You are scheduled on 03/11/22  at 12:00 noon . You should arrive 30 minutes prior to your appointment time for registration.  The solution may taste better if refrigerated, but do NOT add ice or any other liquid to this solution. Shake well before drinking.   Please follow the written instructions below on the day of your exam:   1) Do not eat anything after 8:00am (4 hours prior to your test)   2) Drink 1 bottle of contrast @ 10:00am (2 hours prior to your exam)  Remember to shake well before drinking and do NOT pour over ice.     Drink 1 bottle of contrast @ 11:00am (1 hour prior to your exam)   You may take any medications as prescribed with a small amount of water, if necessary. If you take any of the following medications: METFORMIN, GLUCOPHAGE, GLUCOVANCE, AVANDAMET, RIOMET, FORTAMET, [place] MET, JANUMET, GLUMETZA or METAGLIP, you MAY be asked to HOLD this medication 48 hours AFTER the exam.   The purpose of you drinking the oral contrast is to aid in the visualization of your intestinal tract. The contrast solution may cause some diarrhea. Depending on your individual set of symptoms, you may also receive an intravenous injection of x-ray contrast/dye. Plan on being at [facility] for 45 minutes or longer, depending on the type of exam you are having performed.   If you have any questions regarding your exam or if you need to reschedule, you may call [facility] at [phone] between the hours of 8:00 am and 5:00 pm, Monday-Friday.    We have sent the following medications to your pharmacy for you to pick up at your convenience: Suprep , Nexium( do not start nexium until  after stool study have been turned in)    You have been scheduled for an endoscopy and colonoscopy. Please follow the written instructions given to you at your visit today. Please pick up your prep supplies at the pharmacy within the next 1-3 days. If you use inhalers (even only as needed), please bring them with you on the day of your procedure.   Thank you for choosing me and [facility].  Dr. Rush Landmark

## 2022-03-02 NOTE — Progress Notes (Unsigned)
GASTROENTEROLOGY OUTPATIENT CLINIC VISIT   Primary Care Provider Girtha Rm, NP-C [place] [address] [phone]  Referring Provider Girtha Rm, NP-C [address] [place],  [place] [address] [phone]  Patient Profile: Flara Storti Mcreynolds is a 46 y.o. female with a pmh significant for  The patient presents to the [facility] for an evaluation and management of problem(s) noted below:  Problem List No diagnosis found.  History of Present Illness    The patient does/does not take NSAIDs or BC/Goody Powder. Patient has/has not had an EGD. Patient has/has not had a Colonoscopy.  GI Review of Systems Positive as above Negative for  Pyrosis; Reflux; Regurgitation; Dysphagia; Odynophagia; Globus; Post-prandial cough; Nocturnal cough; Nasal regurgitation; Epigastric pain; Nausea; Vomiting; Hematemesis; Jaundice; Change in Appetite; Early satiety; Abdominal pain; Abdominal bloating; Eructation; Flatulence; Change in BM Frequency; Change in BM Consistency; Constipation; Diarrhea; Incontinence; Urgency; Tenesmus; Hematochezia; Melena  Review of Systems General: Denies fevers/chills/weight loss/night sweats HEENT: Denies oral lesions/sore throat/headaches/visual changes Cardiovascular: Denies chest pain/palpitations Pulmonary: Denies shortness of breath/cough Gastroenterological: See HPI Genitourinary: Denies darkened urine or hematuria Hematological: Denies easy bruising/bleeding Endocrine: Denies temperature intolerance Dermatological: Denies skin changes Psychological: Mood is stable Allergy & Immunology: Denies severe allergic reactions Musculoskeletal: Denies new arthralgias   Medications Current Outpatient Medications  Medication Sig Dispense Refill   ascorbic acid (VITAMIN C) 500 MG tablet Take 500 mg by mouth daily.     ibuprofen (ADVIL) 800 MG tablet Take 1 tablet (800 mg total) by mouth every 8 (eight)  hours as needed for moderate pain. 21 tablet 0   No current facility-administered medications for this visit.    Allergies Allergies  Allergen Reactions   Doxycycline Nausea Only   Elemental Sulfur Hives   Penicillins Swelling    Did it involve swelling of the face/tongue/throat, SOB, or low BP? No Did it involve sudden or severe rash/hives, skin peeling, or any reaction on the inside of your mouth or nose? No Did you need to seek medical attention at a hospital or doctor's office? No When did it last happen?      childhood If all above answers are "NO", may proceed with cephalosporin use.     Histories Past Medical History:  Diagnosis Date   Back problem    History of iritis    Thrombocytosis 05/20/2019   Past Surgical History:  Procedure Laterality Date   ABDOMINAL HYSTERECTOMY     ABDOMINAL SURGERY     APPENDECTOMY     BACK SURGERY     CESAREAN SECTION     KNEE SURGERY     ROBOTIC ASSISTED TOTAL HYSTERECTOMY WITH BILATERAL SALPINGO OOPHERECTOMY Bilateral 11/25/2019   Procedure: XI ROBOTIC ASSISTED TOTAL LAPAROSCOPIC HYSTERECTOMY WITH BILATERAL SALPINGECTOMY;  Surgeon: [name], MD;  Location: [place];  Service: Gynecology;  Laterality: Bilateral;  request 7:30am OR time in [place] block requests 2 hours OR time Tracie to RNFA confirmed on 10/03/19   TUBAL LIGATION     Social History   Socioeconomic History   Marital status: Legally Separated    Spouse name: Not on file   Number of children: Not on file   Years of education: Not on file   Highest education level: Not on file  Occupational History   Not on file  Tobacco Use   Smoking status: Former    Packs/day: 0.20    Years: 29.00    Total pack years: 5.80  Types: Cigarettes    Quit date: 07/04/2019    Years since quitting: 2.6   Smokeless tobacco: Never  Vaping Use   Vaping Use: Never used  Substance and Sexual Activity   Alcohol use: Not Currently   Drug use: No    Sexual activity: Yes    Partners: Male    Birth control/protection: Surgical    Comment: 1st intercourse- 15, partners-  more than 5, hysterectomy  Other Topics Concern   Not on file  Social History Narrative   Not on file   Social Determinants of Health   Financial Resource Strain: Not on file  Food Insecurity: Not on file  Transportation Needs: Not on file  Physical Activity: Not on file  Stress: Not on file  Social Connections: Not on file  Intimate Partner Violence: Not on file   Family History  Problem Relation Age of Onset   Diabetes Mother    Hypertension Mother    Drug abuse Mother    Heart attack Mother    CAD Mother 15   Heart failure Father 59       Heart Failure   Diabetes Daughter    Diabetes Daughter    I have reviewed her medical, social, and family history in detail and updated the electronic medical record as necessary.    PHYSICAL EXAMINATION  Ht '5\' 2"'$  (1.575 m)   Wt 172 lb (78 kg)   LMP 11/15/2019   BMI 31.46 kg/m  Wt Readings from Last 3 Encounters:  03/02/22 172 lb (78 kg)  02/13/22 170 lb (77.1 kg)  04/23/20 167 lb 3.2 oz (75.8 kg)   GEN: NAD, appears stated age, doesn't appear chronically ill PSYCH: Cooperative, without pressured speech EYE: Conjunctivae pink, sclerae anicteric ENT: MMM, without oral ulcers, no erythema or exudates noted NECK: Supple CV: RR without R/Gs  RESP: CTAB posteriorly, without wheezing GI: NABS, soft, NT/ND, without rebound or guarding, no HSM appreciated GU: DRE shows MSK/EXT: _ edema, no palmar erythema SKIN: No jaundice, no spider angiomata, no concerning rashes NEURO:  Alert & Oriented x 3, no focal deficits, no evidence of asterixis   REVIEW OF DATA  I reviewed the following data at the time of this encounter:  GI Procedures and Studies  ***  Laboratory Studies  ***  Imaging Studies  June 2023 CTAP IMPRESSION: 1. No acute abnormality or explanation for abdominal pain. 2. Minimal colonic  diverticulosis without diverticulitis. 3. Left ovarian cyst measuring 2.5 cm, likely physiologic. Consensus guidelines recommend no follow-up imaging. Note: This recommendation does not apply to premenarchal patients and to those with increased risk (genetic, family history, elevated tumor markers or other high-risk factors) of ovarian cancer. If left lower quadrant symptoms are felt to be related to ovarian pathology, consider pelvic ultrasound for assessment.   ASSESSMENT  Ms. Raetz is a 46 y.o. female with a pmh significant for The patient is seen today for evaluation and management of:  No diagnosis found.  ***   PLAN  There are no diagnoses linked to this encounter.   No orders of the defined types were placed in this encounter.   New Prescriptions   No medications on file   Modified Medications   No medications on file    Planned Follow Up No follow-ups on file.   Total Time in Face-to-Face and in Coordination of Care for patient including independent/personal interpretation/review of prior testing, medical history, examination, medication adjustment, communicating results with the patient directly, and documentation within the  EHR is ***.   Justice Britain, MD [facility] Office # [phone]

## 2022-03-03 ENCOUNTER — Other Ambulatory Visit: Payer: Managed Care, Other (non HMO)

## 2022-03-03 DIAGNOSIS — R194 Change in bowel habit: Secondary | ICD-10-CM

## 2022-03-03 DIAGNOSIS — R12 Heartburn: Secondary | ICD-10-CM

## 2022-03-03 DIAGNOSIS — Z1211 Encounter for screening for malignant neoplasm of colon: Secondary | ICD-10-CM

## 2022-03-03 DIAGNOSIS — R1012 Left upper quadrant pain: Secondary | ICD-10-CM

## 2022-03-03 DIAGNOSIS — R1032 Left lower quadrant pain: Secondary | ICD-10-CM

## 2022-03-03 DIAGNOSIS — R109 Unspecified abdominal pain: Secondary | ICD-10-CM

## 2022-03-03 DIAGNOSIS — R110 Nausea: Secondary | ICD-10-CM

## 2022-03-04 LAB — HELICOBACTER PYLORI  SPECIAL ANTIGEN
MICRO NUMBER:: 13559670
RESULT:: DETECTED — AB
SPECIMEN QUALITY: ADEQUATE

## 2022-03-05 DIAGNOSIS — R12 Heartburn: Secondary | ICD-10-CM | POA: Insufficient documentation

## 2022-03-05 DIAGNOSIS — R634 Abnormal weight loss: Secondary | ICD-10-CM | POA: Insufficient documentation

## 2022-03-05 DIAGNOSIS — R110 Nausea: Secondary | ICD-10-CM | POA: Insufficient documentation

## 2022-03-05 DIAGNOSIS — R194 Change in bowel habit: Secondary | ICD-10-CM | POA: Insufficient documentation

## 2022-03-05 DIAGNOSIS — R1012 Left upper quadrant pain: Secondary | ICD-10-CM | POA: Insufficient documentation

## 2022-03-05 DIAGNOSIS — R109 Unspecified abdominal pain: Secondary | ICD-10-CM | POA: Insufficient documentation

## 2022-03-05 DIAGNOSIS — R1032 Left lower quadrant pain: Secondary | ICD-10-CM | POA: Insufficient documentation

## 2022-03-05 DIAGNOSIS — Z1211 Encounter for screening for malignant neoplasm of colon: Secondary | ICD-10-CM | POA: Insufficient documentation

## 2022-03-10 ENCOUNTER — Encounter: Payer: Self-pay | Admitting: Gastroenterology

## 2022-03-10 LAB — CALPROTECTIN, FECAL: Calprotectin, Fecal: 69 ug/g (ref 0–120)

## 2022-03-11 ENCOUNTER — Telehealth: Payer: Self-pay | Admitting: Gastroenterology

## 2022-03-11 ENCOUNTER — Encounter (HOSPITAL_COMMUNITY): Payer: Self-pay

## 2022-03-11 ENCOUNTER — Encounter: Payer: Self-pay | Admitting: Physician Assistant

## 2022-03-11 ENCOUNTER — Ambulatory Visit (HOSPITAL_COMMUNITY): Payer: Managed Care, Other (non HMO)

## 2022-03-11 ENCOUNTER — Encounter: Payer: Self-pay | Admitting: Gastroenterology

## 2022-03-11 ENCOUNTER — Ambulatory Visit (INDEPENDENT_AMBULATORY_CARE_PROVIDER_SITE_OTHER): Payer: Managed Care, Other (non HMO) | Admitting: Physician Assistant

## 2022-03-11 VITALS — BP 110/70 | HR 82 | Ht 62.0 in | Wt 168.4 lb

## 2022-03-11 DIAGNOSIS — B029 Zoster without complications: Secondary | ICD-10-CM

## 2022-03-11 DIAGNOSIS — R21 Rash and other nonspecific skin eruption: Secondary | ICD-10-CM

## 2022-03-11 MED ORDER — METHYLPREDNISOLONE 4 MG PO TBPK: ORAL_TABLET | ORAL | 0 refills | Status: DC

## 2022-03-11 MED ORDER — VALACYCLOVIR HCL 1 G PO TABS: 1000.0000 mg | ORAL_TABLET | Freq: Three times a day (TID) | ORAL | 0 refills | Status: AC

## 2022-03-11 NOTE — Telephone Encounter (Signed)
LVM for patient to call back to reschedule her endo and colon on 7/5.

## 2022-03-11 NOTE — Telephone Encounter (Signed)
Pt needs to reschedule her procedure when she has recovered from the shingles.

## 2022-03-11 NOTE — Progress Notes (Signed)
Acute Office Visit  Subjective:    Patient ID: Brandi [facility], female    DOB: 03-19-1976, 46 y.o.   MRN: [id number]  Chief Complaint  Patient presents with   Acute Visit    Woke with rash on her face that burns, it is also inside her mouth. It is causing the whole right side of her face to hurt including her ear.    HPI Patient is in today for a rash on the right side of her face from her right ear to the right side of her mouth; denies any new irritants; reports that she is under a lot of stress and has an upcoming colonoscopy scheduled; denies any new irritants or sick contacts;  denies fever/chills, denies nausea/vomiting, denies diarrhea/constipation   Outpatient Medications Prior to Visit  Medication Sig Dispense Refill   ascorbic acid (VITAMIN C) 500 MG tablet Take 500 mg by mouth daily. (Patient not taking: Reported on 03/11/2022)     esomeprazole (NEXIUM) 40 MG capsule Take 1 capsule (40 mg total) by mouth daily. (Patient not taking: Reported on 03/11/2022) 30 capsule 2   ibuprofen (ADVIL) 800 MG tablet Take 1 tablet (800 mg total) by mouth every 8 (eight) hours as needed for moderate pain. (Patient not taking: Reported on 03/11/2022) 21 tablet 0   Na Sulfate-K Sulfate-Mg Sulf (SUPREP BOWEL PREP KIT) 17.5-3.13-1.6 GM/177ML SOLN Take 1 kit by mouth as directed. For colonoscopy prep (Patient not taking: Reported on 03/11/2022) 354 mL 0   No facility-administered medications prior to visit.    Allergies  Allergen Reactions   Doxycycline Nausea Only   Elemental Sulfur Hives   Penicillins Swelling    Did it involve swelling of the face/tongue/throat, SOB, or low BP? No Did it involve sudden or severe rash/hives, skin peeling, or any reaction on the inside of your mouth or nose? No Did you need to seek medical attention at a hospital or doctor's office? No When did it last happen?      childhood If all above answers are "NO", may proceed with cephalosporin use.     Review of  Systems  Constitutional:  Negative for activity change and chills.  HENT:  Negative for congestion and voice change.   Eyes:  Negative for pain and redness.  Respiratory:  Negative for cough and wheezing.   Cardiovascular:  Negative for chest pain.  Gastrointestinal:  Negative for constipation, diarrhea, nausea and vomiting.  Endocrine: Negative for polyuria.  Genitourinary:  Negative for frequency.  Musculoskeletal:  Positive for myalgias.  Skin:  Positive for color change and rash.  Allergic/Immunologic: Negative for immunocompromised state.  Neurological:  Negative for dizziness.  Psychiatric/Behavioral:  Negative for agitation.        Objective:    Physical Exam Vitals and nursing note reviewed.  Constitutional:      General: She is not in acute distress.    Appearance: Normal appearance. She is not ill-appearing.  HENT:     Head: Normocephalic and atraumatic.      Comments: Area of erythematous papules that are tender to palpation    Right Ear: Tympanic membrane, ear canal and external ear normal. There is no impacted cerumen.     Left Ear: Tympanic membrane, ear canal and external ear normal. There is no impacted cerumen.     Nose: Nose normal. No congestion.     Mouth/Throat:     Mouth: Mucous membranes are moist.  Eyes:     Extraocular Movements: Extraocular movements intact.  Conjunctiva/sclera: Conjunctivae normal.     Pupils: Pupils are equal, round, and reactive to light.  Cardiovascular:     Rate and Rhythm: Normal rate and regular rhythm.     Pulses: Normal pulses.     Heart sounds: Normal heart sounds.  Pulmonary:     Effort: Pulmonary effort is normal.     Breath sounds: Normal breath sounds. No wheezing.  Abdominal:     General: Bowel sounds are normal.     Palpations: Abdomen is soft.  Musculoskeletal:        General: Normal range of motion.     Cervical back: Normal range of motion and neck supple.     Right lower leg: No edema.     Left lower  leg: No edema.  Skin:    General: Skin is warm and dry.     Findings: Rash present. No bruising. Rash is papular.  Neurological:     General: No focal deficit present.     Mental Status: She is alert and oriented to person, place, and time.  Psychiatric:        Mood and Affect: Mood normal.        Behavior: Behavior normal.        Thought Content: Thought content normal.     BP 110/70   Pulse 82   Wt 168 lb 6.4 oz (76.4 kg)   LMP 11/15/2019   SpO2 98%   BMI 30.80 kg/m   Wt Readings from Last 3 Encounters:  03/11/22 168 lb 6.4 oz (76.4 kg)  03/02/22 172 lb (78 kg)  02/13/22 170 lb (77.1 kg)    Results for orders placed or performed in visit on 03/03/22  Calprotectin, Fecal   Specimen: Stool   Stool  Result Value Ref Range   Calprotectin, Fecal 69 0 - 867 ug/g  Helicobacter pylori special antigen   Specimen: Stool  Result Value Ref Range   MICRO NUMBER: [id number]    SPECIMEN QUALITY Adequate    SOURCE: STOOL    STATUS: FINAL    RESULT: Detected (A)        Assessment & Plan:  1. Rash of face  2. Herpes zoster without complication  - valtrex and medrol dosepak - If symptoms get worse, please call the office for a follow up appointment if available, otherwise go to Urgent Care, call 911 / EMS, or go to the Emergency Department for further evaluation.   Meds ordered this encounter  Medications   valACYclovir (VALTREX) 1000 MG tablet    Sig: Take 1 tablet (1,000 mg total) by mouth 3 (three) times daily for 7 days.    Dispense:  21 tablet    Refill:  0    Order Specific Question:   Supervising Provider    Answer:   Denita Lung [[id number]]   methylPREDNISolone (MEDROL DOSEPAK) 4 MG TBPK tablet    Sig: Take as directed    Dispense:  21 tablet    Refill:  0    Order Specific Question:   Supervising Provider    Answer:   Denita Lung [[id number]]    Return in about 6 months (around 09/10/2022) for Return for Annual Exam.  Brandi Pap, PA-C

## 2022-03-11 NOTE — Telephone Encounter (Addendum)
Inbound call from patient stating that she woke up this morning with a rash on her face and went to see her PCP and they told her that she has Shingles. Patient is scheduled for a colonoscopy on 7/5 and wanting to let our office know and is seeking advice on what she needs to do. Please advise.

## 2022-03-13 ENCOUNTER — Encounter (HOSPITAL_BASED_OUTPATIENT_CLINIC_OR_DEPARTMENT_OTHER): Payer: Self-pay | Admitting: Emergency Medicine

## 2022-03-13 ENCOUNTER — Emergency Department (HOSPITAL_BASED_OUTPATIENT_CLINIC_OR_DEPARTMENT_OTHER)
Admission: EM | Admit: 2022-03-13 | Discharge: 2022-03-13 | Disposition: A | Discharge status: Home / Self Care | Payer: Managed Care, Other (non HMO) | Attending: Emergency Medicine | Admitting: Emergency Medicine

## 2022-03-13 ENCOUNTER — Other Ambulatory Visit: Payer: Self-pay

## 2022-03-13 DIAGNOSIS — Z87891 Personal history of nicotine dependence: Secondary | ICD-10-CM | POA: Diagnosis not present

## 2022-03-13 DIAGNOSIS — B028 Zoster with other complications: Secondary | ICD-10-CM | POA: Diagnosis not present

## 2022-03-13 DIAGNOSIS — R21 Rash and other nonspecific skin eruption: Secondary | ICD-10-CM | POA: Diagnosis present

## 2022-03-13 MED ORDER — MORPHINE SULFATE (PF) 4 MG/ML IV SOLN
4.0000 mg | Freq: Once | INTRAVENOUS | Status: AC
  Administered 2022-03-13: 4 mg via INTRAMUSCULAR
  Filled 2022-03-13: qty 1

## 2022-03-13 MED ORDER — OXYCODONE-ACETAMINOPHEN 5-325 MG PO TABS: 1.0000 | ORAL_TABLET | Freq: Four times a day (QID) | ORAL | 0 refills | Status: DC | PRN

## 2022-03-13 MED ORDER — GABAPENTIN 300 MG PO CAPS: ORAL_CAPSULE | ORAL | 0 refills | Status: DC

## 2022-03-13 MED ORDER — PREDNISONE 20 MG PO TABS: 60.0000 mg | ORAL_TABLET | Freq: Every day | ORAL | 0 refills | Status: AC

## 2022-03-13 NOTE — Discharge Instructions (Signed)
Unfortunately, there is not much else we can do for the pain from your shingles infection aside from what you are already doing.  I have sent you in a higher dose of prednisone that I want you to take for 5 days.  Please discontinue the other prednisone that you were given.  Additionally, I have sent you in a few tablets of nerve pain medication called gabapentin.  Follow instructions on the bottle for this regarding dosing.  Continue taking Tylenol or Motrin for pain and discomfort and I have also sent you in some stronger pain meds to use for breakthrough pain.  You may have lingering pain for a couple of weeks.  Keep the lesions clean and as dry as possible.  Follow-up with your primary care doctor if you are not improving.

## 2022-03-13 NOTE — ED Triage Notes (Signed)
Pt arrives pov, steady gait, endorses recent dx with shingles on right side of face, and reports increase in pain, decrease in sleep.

## 2022-03-13 NOTE — ED Provider Notes (Signed)
[facility] Provider Note   CSN: [id number] Arrival date & time: 03/13/22  1105     History  Chief Complaint  Patient presents with   Facial Pain   Rash    [facility] is a 46 y.o. female who is presents to the ED for evaluation of worsening rash and facial pain secondary to shingles that was diagnosed 3 days ago.  Patient states that she was initially seen by her PCP for evaluation of the rash and was sent home with Valtrex 1 g 3 times daily x7 days along with Medrol 4 mg Dosepak.  This is her third day of medications but she reports worsening rash and pain.  She is having difficulty eating, sleeping and performing daily activities secondary to pain.  She believes the rash is now spread up into her scalp.  She denies fever, chills, eye pain, vision changes, hearing changes.   Rash Associated symptoms: no fever        Home Medications Prior to Admission medications   Medication Sig Start Date End Date Taking? Authorizing Provider  gabapentin (NEURONTIN) 300 MG capsule Take 1 capsule on day one, then 1 capsule on day 2, 3 capsules on day 3 and then continue taking 3 capsules per day after that 03/13/22  Yes Tonye Pearson, PA-C  oxyCODONE-acetaminophen (PERCOCET/ROXICET) 5-325 MG tablet Take 1 tablet by mouth every 6 (six) hours as needed for severe pain. 03/13/22  Yes Kathe Becton R, PA-C  predniSONE (DELTASONE) 20 MG tablet Take 3 tablets (60 mg total) by mouth daily for 5 days. 03/13/22 03/18/22 Yes Tonye Pearson, PA-C  ascorbic acid (VITAMIN C) 500 MG tablet Take 500 mg by mouth daily. Patient not taking: Reported on 03/11/2022    [provider]  esomeprazole (NEXIUM) 40 MG capsule Take 1 capsule (40 mg total) by mouth daily. Patient not taking: Reported on 03/11/2022 03/02/22   Mansouraty, Telford Nab., MD  ibuprofen (ADVIL) 800 MG tablet Take 1 tablet (800 mg total) by mouth every 8 (eight) hours as needed for moderate pain. Patient  not taking: Reported on 03/11/2022 11/14/68   Campbell Stall P, DO  methylPREDNISolone (MEDROL DOSEPAK) 4 MG TBPK tablet Take as directed 03/11/22   Francis Gaines B, PA-C  Na Sulfate-K Sulfate-Mg Sulf (SUPREP BOWEL PREP KIT) 17.5-3.13-1.6 GM/177ML SOLN Take 1 kit by mouth as directed. For colonoscopy prep Patient not taking: Reported on 03/11/2022 03/02/22   Mansouraty, Telford Nab., MD  valACYclovir (VALTREX) 1000 MG tablet Take 1 tablet (1,000 mg total) by mouth 3 (three) times daily for 7 days. 03/11/22 03/18/22  Francis Gaines B, PA-C      Allergies    Doxycycline, Elemental sulfur, and Penicillins    Review of Systems   Review of Systems  Constitutional:  Negative for fever.  Eyes:  Negative for visual disturbance.  Skin:  Positive for rash.    Physical Exam Updated Vital Signs BP 136/84   Pulse 70   Temp 98.1 F (36.7 C) (Oral)   Resp 16   Ht 5' 2" (1.575 m)   Wt 72.6 kg   LMP 11/15/2019   SpO2 97%   BMI 29.26 kg/m  Physical Exam Vitals and nursing note reviewed.  Constitutional:      General: She is not in acute distress.    Appearance: She is not ill-appearing.  HENT:     Head: Atraumatic.     Ears:     Comments: Small vesicles noted within  the EAC Eyes:     Extraocular Movements: Extraocular movements intact.     Conjunctiva/sclera: Conjunctivae normal.     Pupils: Pupils are equal, round, and reactive to light.     Comments: Visual fields intact.  Right eye without redness, erythema.  She is able to fully close and open her eye without pain.  Cardiovascular:     Rate and Rhythm: Normal rate and regular rhythm.     Pulses: Normal pulses.     Heart sounds: No murmur heard. Pulmonary:     Effort: Pulmonary effort is normal. No respiratory distress.     Breath sounds: Normal breath sounds.  Abdominal:     General: Abdomen is flat. There is no distension.     Palpations: Abdomen is soft.     Tenderness: There is no abdominal tenderness.  Musculoskeletal:         General: Normal range of motion.     Cervical back: Normal range of motion.  Skin:    General: Skin is warm and dry.     Capillary Refill: Capillary refill takes less than 2 seconds.     Comments: Vesicular rash in various phases of crusting extending from the right lip up into the right ear and surrounding scalp area.  No evidence of infection  Neurological:     General: No focal deficit present.     Mental Status: She is alert.     Comments: Mild right-sided facial droop noted.  Psychiatric:        Mood and Affect: Mood normal.     ED Results / Procedures / Treatments   Labs (all labs ordered are listed, but only abnormal results are displayed) Labs Reviewed - No data to display  EKG None  Radiology No results found.  Procedures Procedures    Medications Ordered in ED Medications  morphine (PF) 4 MG/ML injection 4 mg (4 mg Intramuscular Given 03/13/22 1337)    ED Course/ Medical Decision Making/ A&P                           Medical Decision Making Risk Prescription drug management.   Social determinants of health:  Social History   Socioeconomic History   Marital status: Legally Separated    Spouse name: Not on file   Number of children: Not on file   Years of education: Not on file   Highest education level: Not on file  Occupational History   Not on file  Tobacco Use   Smoking status: Former    Packs/day: 0.20    Years: 29.00    Total pack years: 5.80    Types: Cigarettes    Quit date: 07/04/2019    Years since quitting: 2.6   Smokeless tobacco: Never  Vaping Use   Vaping Use: Never used  Substance and Sexual Activity   Alcohol use: Yes    Comment: occ   Drug use: No   Sexual activity: Yes    Partners: Male    Birth control/protection: Surgical    Comment: 1st intercourse- 15, partners-  more than 5, hysterectomy  Other Topics Concern   Not on file  Social History Narrative   Not on file   Social Determinants of Health   Financial  Resource Strain: Not on file  Food Insecurity: Not on file  Transportation Needs: Not on file  Physical Activity: Not on file  Stress: Not on file  Social Connections: Not on file  Intimate Partner Violence: Not on file     Initial impression:  This patient presents to the ED for concern of herpes zoster rash, this involves an extensive number of treatment options, and is a complaint that carries with it a high risk of complications and morbidity.   Differentials include herpes zoster, infected vesicles, herpes zoster ophthalmicus, ramsay hunt  Comorbidities affecting care:  None  Additional history obtained: PCP records from 03/11/2022    Medicines ordered and prescription drug management:  I ordered medication including: Morphine 4 mils Reevaluation of the patient after these medicines showed that the patient improved I have reviewed the patients home medicines and have made adjustments as needed  Consultations: Requested consultation with ENT and spoke with Dr. Fredric Dine and discussed patient's symptoms and diagnosis.  She notes that patient is already on appropriate treatment with Valtrex 3 times daily and unfortunately, there is no changes in therapy even should she have Ramsay Hunt syndrome.  She recommends continuing with the Valtrex and the prednisone.  She also recommends evaluating patient's eye to ensure that she can completely open and close her eye.  If she is unable to completely close her eye, she should be discharged home with prescription lubricating eyedrops.  Until then, patient can also use OTC lubricating eyedrops.  If patient's symptoms continue to worsen, the only other option is admission for IV antivirals.   ED Course/Re-evaluation: Patient overall appears nontoxic and in no acute distress although she is uncomfortable and often tearful.  Exam significant for vesicular rash in various stages of crusting extending from the right side of her lip up into her  ear.  Small vesicles noted within the right EAC.  Visual fields intact of the right eye and patient is able to fully close and open her eye without pain or discomfort.  No redness or scleral injection.  Concern for progression into Ramsay Hunt versus herpetic neuritis of the eye.  I called ENT and was told that should this be Ramsay Hunt, unfortunately the treatment for that is the same.  Patient is to continue with her Valtrex 3 times daily along with pain management.  Should her symptoms worsen, she can return to the ED and will likely need to be admitted for IV antiviral therapy.  Per up-to-date, typically prednisone dosing recommendations is 1 mg/kg x 5 days, and her current 4 mg Medrol Dosepak is significantly lower than this.  Advised patient to discontinue the Dosepak and will discharge home with 60 mg prednisone x5 days. Discussed with patient importance of monitoring symptoms in her eye including pain, redness and ability or inability to open and close the eye.  She is to pick up OTC eyedrop medications to help with lubrication.  We will also discharge home with gabapentin and pain medication.  Patient expresses understanding and is amenable to plan.  Disposition:  After consideration of the diagnostic results, physical exam, history and the patients response to treatment feel that the patent would benefit from discharge.   Herpes zoster with complication: Plan and management as described above. Discharged home in good condition.  Final Clinical Impression(s) / ED Diagnoses Final diagnoses:  Herpes zoster with complication    Rx / DC Orders ED Discharge Orders          Ordered    predniSONE (DELTASONE) 20 MG tablet  Daily        03/13/22 1437    gabapentin (NEURONTIN) 300 MG capsule        03/13/22 1437  oxyCODONE-acetaminophen (PERCOCET/ROXICET) 5-325 MG tablet  Every 6 hours PRN        03/13/22 1437              Tonye Pearson, PA-C 03/13/22 1543    Malvin Johns,  MD 03/17/22 1501

## 2022-03-14 NOTE — Telephone Encounter (Signed)
Called to speak with patient. She states that she has shingles and she was able to get her appt rescheduled to 03/31/22. I told her that I will send her updated instructions via MyChart. Pt verbalized understanding and had no concerns at the end of the call.

## 2022-03-16 ENCOUNTER — Other Ambulatory Visit: Payer: Self-pay

## 2022-03-16 ENCOUNTER — Encounter: Payer: Managed Care, Other (non HMO) | Admitting: Gastroenterology

## 2022-03-16 DIAGNOSIS — R194 Change in bowel habit: Secondary | ICD-10-CM

## 2022-03-31 ENCOUNTER — Encounter: Payer: Self-pay | Admitting: Gastroenterology

## 2022-03-31 ENCOUNTER — Ambulatory Visit: Payer: Managed Care, Other (non HMO) | Admitting: Gastroenterology

## 2022-03-31 VITALS — BP 149/86 | HR 63 | Temp 98.0°F | Resp 14 | Ht 62.0 in | Wt 172.0 lb

## 2022-03-31 DIAGNOSIS — R197 Diarrhea, unspecified: Secondary | ICD-10-CM

## 2022-03-31 DIAGNOSIS — K2970 Gastritis, unspecified, without bleeding: Secondary | ICD-10-CM | POA: Diagnosis not present

## 2022-03-31 DIAGNOSIS — K641 Second degree hemorrhoids: Secondary | ICD-10-CM

## 2022-03-31 DIAGNOSIS — R194 Change in bowel habit: Secondary | ICD-10-CM

## 2022-03-31 DIAGNOSIS — B9681 Helicobacter pylori [H. pylori] as the cause of diseases classified elsewhere: Secondary | ICD-10-CM

## 2022-03-31 DIAGNOSIS — K644 Residual hemorrhoidal skin tags: Secondary | ICD-10-CM

## 2022-03-31 DIAGNOSIS — R1012 Left upper quadrant pain: Secondary | ICD-10-CM

## 2022-03-31 DIAGNOSIS — K5730 Diverticulosis of large intestine without perforation or abscess without bleeding: Secondary | ICD-10-CM | POA: Diagnosis not present

## 2022-03-31 DIAGNOSIS — K2950 Unspecified chronic gastritis without bleeding: Secondary | ICD-10-CM | POA: Diagnosis not present

## 2022-03-31 DIAGNOSIS — R109 Unspecified abdominal pain: Secondary | ICD-10-CM | POA: Diagnosis not present

## 2022-03-31 MED ORDER — SODIUM CHLORIDE 0.9 % IV SOLN: 500.0000 mL | Freq: Once | INTRAVENOUS | Status: DC

## 2022-03-31 NOTE — Op Note (Signed)
[facility] Patient Name: Brandi Durham Procedure Date: 03/31/2022 3:09 PM MRN: [id number] Endoscopist: Justice Britain , MD Age: 46 Referring MD:  Date of Birth: Nov 18, 1975 Gender: Female Account #: [id number] Procedure:                Colonoscopy Indications:              Screening for colorectal malignant neoplasm,                            Incidental abdominal pain noted, Incidental                            abdominal distress noted, Incidental change in                            bowel habits noted, Incidental diarrhea noted Medicines:                Monitored Anesthesia Care Procedure:                Pre-Anesthesia Assessment:                           - Prior to the procedure, a History and Physical                            was performed, and patient medications and                            allergies were reviewed. The patient's tolerance of                            previous anesthesia was also reviewed. The risks                            and benefits of the procedure and the sedation                            options and risks were discussed with the patient.                            All questions were answered, and informed consent                            was obtained. Prior Anticoagulants: The patient has                            taken no previous anticoagulant or antiplatelet                            agents except for NSAID medication. ASA Grade                            Assessment: II - A patient with mild systemic  disease. After reviewing the risks and benefits,                            the patient was deemed in satisfactory condition to                            undergo the procedure.                           After obtaining informed consent, the colonoscope                            was passed under direct vision. Throughout the                            procedure, the patient's blood pressure, pulse, and                             oxygen saturations were monitored continuously. The                            CF [id number] #[id number] was introduced through the anus                            and advanced to the 8 cm into the ileum. The                            colonoscopy was performed without difficulty. The                            patient tolerated the procedure. The quality of the                            bowel preparation was good. The terminal ileum,                            ileocecal valve, appendiceal orifice, and rectum                            were photographed. Scope In: 3:29:50 PM Scope Out: 3:41:24 PM Scope Withdrawal Time: 0 hours 9 minutes 32 seconds  Total Procedure Duration: 0 hours 11 minutes 34 seconds  Findings:                 Skin tags were found on perianal exam.                           The digital rectal exam findings include                            hemorrhoids. Pertinent negatives include no                            palpable rectal lesions.  Patchy inflammation, mild in severity and                            characterized by a few erosions and friability was                            found in the terminal ileum. Biopsies were taken                            with a cold forceps for histology.                           The ileocecal valve appeared normal.                           A few small-mouthed diverticula were found in the                            transverse colon.                           Normal mucosa was found in the entire colon                            otherwise. Biopsies for histology were taken with a                            cold forceps from the entire colon for evaluation                            of microscopic colitis.                           Non-bleeding non-thrombosed external and internal                            hemorrhoids were found during retroflexion, during                             perianal exam and during digital exam. The                            hemorrhoids were Grade II (internal hemorrhoids                            that prolapse but reduce spontaneously). Complications:            No immediate complications. Estimated Blood Loss:     Estimated blood loss was minimal. Impression:               - Perianal skin tags found on perianal exam.                            Hemorrhoids found on digital rectal exam.                           -  Ileal mucosal inflammation - biopsied to rule out                            chronic ileitis.                           - The ICV was normal.                           - Few diverticulosis in the transverse colon.                           - Normal mucosa in the entire examined colon                            otherwise. Biopsied.                           - Non-bleeding non-thrombosed external and internal                            hemorrhoids. Recommendation:           - The patient will be observed post-procedure,                            until all discharge criteria are met.                           - Discharge patient to home.                           - Patient has a contact number available for                            emergencies. The signs and symptoms of potential                            delayed complications were discussed with the                            patient. Return to normal activities tomorrow.                            Written discharge instructions were provided to the                            patient.                           - High fiber diet.                           - Use FiberCon 1-2 tablets PO daily.                           - Continue present medications.                           -  Await pathology results.                           - If symptoms of diarrhea persist, consider SIBO                            breath testing and IBS-D therapies.                           - Repeat  colonoscopy for surveillance based on                            pathology results - if evidence of chronic colitis                            or ileitis then repeat Colonoscopy within 1-year                            may be recommended. If no evidence of chronic                            inflammation then likely 10-year follow up                            colonoscopy.                           - The findings and recommendations were discussed                            with the patient.                           - The findings and recommendations were discussed                            with the patient's family. Justice Britain, MD 03/31/2022 3:51:08 PM

## 2022-03-31 NOTE — Progress Notes (Signed)
GASTROENTEROLOGY PROCEDURE H&P NOTE   Primary Care Physician: Girtha Rm, NP-C  HPI: Brandi Durham is a 46 y.o. female who presents for EGD/colonoscopy for evaluation of abdominal pain, nausea, pyrosis, flank pain, change in bowel habits, unintentional weight loss, colon cancer screening #1.  Past Medical History:  Diagnosis Date   Back problem    History of iritis    Thrombocytosis 05/20/2019   Past Surgical History:  Procedure Laterality Date   ABDOMINAL HYSTERECTOMY     ABDOMINAL SURGERY     APPENDECTOMY     BACK SURGERY     CESAREAN SECTION     KNEE SURGERY     ROBOTIC ASSISTED TOTAL HYSTERECTOMY WITH BILATERAL SALPINGO OOPHERECTOMY Bilateral 11/25/2019   Procedure: XI ROBOTIC ASSISTED TOTAL LAPAROSCOPIC HYSTERECTOMY WITH BILATERAL SALPINGECTOMY;  Surgeon: [name], MD;  Location: [facility];  Service: Gynecology;  Laterality: Bilateral;  request 7:30am OR time in [place] block requests 2 hours OR time Tracie to RNFA confirmed on 10/03/19   TUBAL LIGATION     Current Outpatient Medications  Medication Sig Dispense Refill   ascorbic acid (VITAMIN C) 500 MG tablet Take 500 mg by mouth daily. (Patient not taking: Reported on 03/11/2022)     esomeprazole (NEXIUM) 40 MG capsule Take 1 capsule (40 mg total) by mouth daily. (Patient not taking: Reported on 03/11/2022) 30 capsule 2   gabapentin (NEURONTIN) 300 MG capsule Take 1 capsule on day one, then 1 capsule on day 2, 3 capsules on day 3 and then continue taking 3 capsules per day after that 95 capsule 0   ibuprofen (ADVIL) 800 MG tablet Take 1 tablet (800 mg total) by mouth every 8 (eight) hours as needed for moderate pain. (Patient not taking: Reported on 03/11/2022) 21 tablet 0   methylPREDNISolone (MEDROL DOSEPAK) 4 MG TBPK tablet Take as directed 21 tablet 0   Na Sulfate-K Sulfate-Mg Sulf (SUPREP BOWEL PREP KIT) 17.5-3.13-1.6 GM/177ML SOLN Take 1 kit by mouth as directed. For  colonoscopy prep (Patient not taking: Reported on 03/11/2022) 354 mL 0   oxyCODONE-acetaminophen (PERCOCET/ROXICET) 5-325 MG tablet Take 1 tablet by mouth every 6 (six) hours as needed for severe pain. 15 tablet 0   No current facility-administered medications for this visit.    Current Outpatient Medications:    ascorbic acid (VITAMIN C) 500 MG tablet, Take 500 mg by mouth daily. (Patient not taking: Reported on 03/11/2022), Disp: , Rfl:    esomeprazole (NEXIUM) 40 MG capsule, Take 1 capsule (40 mg total) by mouth daily. (Patient not taking: Reported on 03/11/2022), Disp: 30 capsule, Rfl: 2   gabapentin (NEURONTIN) 300 MG capsule, Take 1 capsule on day one, then 1 capsule on day 2, 3 capsules on day 3 and then continue taking 3 capsules per day after that, Disp: 95 capsule, Rfl: 0   ibuprofen (ADVIL) 800 MG tablet, Take 1 tablet (800 mg total) by mouth every 8 (eight) hours as needed for moderate pain. (Patient not taking: Reported on 03/11/2022), Disp: 21 tablet, Rfl: 0   methylPREDNISolone (MEDROL DOSEPAK) 4 MG TBPK tablet, Take as directed, Disp: 21 tablet, Rfl: 0   Na Sulfate-K Sulfate-Mg Sulf (SUPREP BOWEL PREP KIT) 17.5-3.13-1.6 GM/177ML SOLN, Take 1 kit by mouth as directed. For colonoscopy prep (Patient not taking: Reported on 03/11/2022), Disp: 354 mL, Rfl: 0   oxyCODONE-acetaminophen (PERCOCET/ROXICET) 5-325 MG tablet, Take 1 tablet by mouth every 6 (six) hours as needed for severe pain., Disp: 15 tablet, Rfl: 0  Allergies  Allergen Reactions   Doxycycline Nausea Only   Elemental Sulfur Hives   Penicillins Swelling    Did it involve swelling of the face/tongue/throat, SOB, or low BP? No Did it involve sudden or severe rash/hives, skin peeling, or any reaction on the inside of your mouth or nose? No Did you need to seek medical attention at a hospital or doctor's office? No When did it last happen?      childhood If all above answers are "NO", may proceed with cephalosporin use.     Family History  Problem Relation Age of Onset   Diabetes Mother    Hypertension Mother    Drug abuse Mother    Heart attack Mother    CAD Mother 69   Heart failure Father 39       Heart Failure   Diabetes Daughter    Diabetes Daughter    Colon cancer Neg Hx    Esophageal cancer Neg Hx    Inflammatory bowel disease Neg Hx    Liver disease Neg Hx    Pancreatic cancer Neg Hx    Stomach cancer Neg Hx    Rectal cancer Neg Hx    Social History   Socioeconomic History   Marital status: Legally Separated    Spouse name: Not on file   Number of children: Not on file   Years of education: Not on file   Highest education level: Not on file  Occupational History   Not on file  Tobacco Use   Smoking status: Former    Packs/day: 0.20    Years: 29.00    Total pack years: 5.80    Types: Cigarettes    Quit date: 07/04/2019    Years since quitting: 2.7   Smokeless tobacco: Never  Vaping Use   Vaping Use: Never used  Substance and Sexual Activity   Alcohol use: Yes    Comment: occ   Drug use: No   Sexual activity: Yes    Partners: Male    Birth control/protection: Surgical    Comment: 1st intercourse- 15, partners-  more than 5, hysterectomy  Other Topics Concern   Not on file  Social History Narrative   Not on file   Social Determinants of Health   Financial Resource Strain: Not on file  Food Insecurity: Not on file  Transportation Needs: Not on file  Physical Activity: Not on file  Stress: Not on file  Social Connections: Not on file  Intimate Partner Violence: Not on file    Physical Exam: There were no vitals filed for this visit. There is no height or weight on file to calculate BMI. GEN: NAD EYE: Sclerae anicteric ENT: MMM CV: Non-tachycardic GI: Soft, NT/ND NEURO:  Alert & Oriented x 3  Lab Results: No results for input(s): "WBC", "HGB", "HCT", "PLT" in the last 72 hours. BMET No results for input(s): "NA", "K", "CL", "CO2", "GLUCOSE", "BUN",  "CREATININE", "CALCIUM" in the last 72 hours. LFT No results for input(s): "PROT", "ALBUMIN", "AST", "ALT", "ALKPHOS", "BILITOT", "BILIDIR", "IBILI" in the last 72 hours. PT/INR No results for input(s): "LABPROT", "INR" in the last 72 hours.   Impression / Plan: This is a 46 y.o.female who presents for EGD/colonoscopy for evaluation of abdominal pain, nausea, pyrosis, flank pain, change in bowel habits, unintentional weight loss, colon cancer screening #1.  The risks and benefits of endoscopic evaluation/treatment were discussed with the patient and/or family; these include but are not limited to the risk of perforation, infection,  bleeding, missed lesions, lack of diagnosis, severe illness requiring hospitalization, as well as anesthesia and sedation related illnesses.  The patient's history has been reviewed, patient examined, no change in status, and deemed stable for procedure.  The patient and/or family is agreeable to proceed.    Justice Britain, MD [facility] Office # [phone]

## 2022-03-31 NOTE — Op Note (Signed)
[facility] Patient Name: Brandi Durham Procedure Date: 03/31/2022 3:13 PM MRN: [id number] Endoscopist: Justice Britain , MD Age: 46 Referring MD:  Date of Birth: 08/20/1976 Gender: Female Account #: [id number] Procedure:                Upper GI endoscopy Indications:              Abdominal pain in the left upper quadrant,                            Abdominal pain in the left lower quadrant,                            Heartburn, Nausea, Weight loss Medicines:                Monitored Anesthesia Care Procedure:                Pre-Anesthesia Assessment:                           - Prior to the procedure, a History and Physical                            was performed, and patient medications and                            allergies were reviewed. The patient's tolerance of                            previous anesthesia was also reviewed. The risks                            and benefits of the procedure and the sedation                            options and risks were discussed with the patient.                            All questions were answered, and informed consent                            was obtained. Prior Anticoagulants: The patient has                            taken no previous anticoagulant or antiplatelet                            agents except for NSAID medication. ASA Grade                            Assessment: II - A patient with mild systemic                            disease. After reviewing the risks and benefits,  the patient was deemed in satisfactory condition to                            undergo the procedure.                           After obtaining informed consent, the endoscope was                            passed under direct vision. Throughout the                            procedure, the patient's blood pressure, pulse, and                            oxygen saturations were monitored continuously. The                             GIF [id number] #[id number] was introduced through the                            mouth, and advanced to the second part of duodenum.                            The upper GI endoscopy was accomplished without                            difficulty. The patient tolerated the procedure. Scope In: Scope Out: Findings:                 No gross lesions were noted in the entire esophagus.                           The Z-line was irregular and was found 38 cm from                            the incisors.                           Diffuse granular mucosa was found in the entire                            examined stomach. Biopsies were taken with a cold                            forceps for histology and Helicobacter pylori                            testing.                           No gross lesions were noted in the duodenal bulb,                            in the first portion  of the duodenum and in the                            second portion of the duodenum. Biopsies were taken                            with a cold forceps for histology. Complications:            No immediate complications. Estimated Blood Loss:     Estimated blood loss was minimal. Impression:               - No gross lesions in esophagus.                           - Z-line irregular, 38 cm from the incisors.                           - Granular gastric mucosa. Biopsied.                           - No gross lesions in the duodenal bulb, in the                            first portion of the duodenum and in the second                            portion of the duodenum. Biopsied. Recommendation:           - Proceed to scheduled colonoscopy.                           - Continue present medications. Recommend PPI use.                           - Await pathology results.                           - The findings and recommendations were discussed                            with the patient.                            - The findings and recommendations were discussed                            with the patient's family. Justice Britain, MD 03/31/2022 3:45:54 PM

## 2022-03-31 NOTE — Progress Notes (Signed)
Called to room to assist during endoscopic procedure.  Patient ID and intended procedure confirmed with present staff. Received instructions for my participation in the procedure from the performing physician.  

## 2022-03-31 NOTE — Progress Notes (Signed)
PT taken to PACU. Monitors in place. VSS. Report given to RN. 

## 2022-03-31 NOTE — Patient Instructions (Signed)
Continue present medications. Await pathology results. Please read handout provided. High Fiber diet. Use FiberCon 1-2 tablets daily.    YOU HAD AN ENDOSCOPIC PROCEDURE TODAY AT [facility]:   Refer to the procedure report that was given to you for any specific questions about what was found during the examination.  If the procedure report does not answer your questions, please call your gastroenterologist to clarify.  If you requested that your care partner not be given the details of your procedure findings, then the procedure report has been included in a sealed envelope for you to review at your convenience later.  YOU SHOULD EXPECT: Some feelings of bloating in the abdomen. Passage of more gas than usual.  Walking can help get rid of the air that was put into your GI tract during the procedure and reduce the bloating. If you had a lower endoscopy (such as a colonoscopy or flexible sigmoidoscopy) you may notice spotting of blood in your stool or on the toilet paper. If you underwent a bowel prep for your procedure, you may not have a normal bowel movement for a few days.  Please Note:  You might notice some irritation and congestion in your nose or some drainage.  This is from the oxygen used during your procedure.  There is no need for concern and it should clear up in a day or so.  SYMPTOMS TO REPORT IMMEDIATELY:  Following lower endoscopy (colonoscopy or flexible sigmoidoscopy):  Excessive amounts of blood in the stool  Significant tenderness or worsening of abdominal pains  Swelling of the abdomen that is new, acute  Fever of 100F or higher  Following upper endoscopy (EGD)  Vomiting of blood or coffee ground material  New chest pain or pain under the shoulder blades  Painful or persistently difficult swallowing  New shortness of breath  Fever of 100F or higher  Black, tarry-looking stools  For urgent or emergent issues, a gastroenterologist can be reached at  any hour by calling [phone]. Do not use MyChart messaging for urgent concerns.    DIET:  We do recommend a small meal at first, but then you may proceed to your regular diet.  Drink plenty of fluids but you should avoid alcoholic beverages for 24 hours.  ACTIVITY:  You should plan to take it easy for the rest of today and you should NOT DRIVE or use heavy machinery until tomorrow (because of the sedation medicines used during the test).    FOLLOW UP: Our staff will call the number listed on your records the next business day following your procedure.  We will call around 7:15- 8:00 am to check on you and address any questions or concerns that you may have regarding the information given to you following your procedure. If we do not reach you, we will leave a message.  If you develop any symptoms (ie: fever, flu-like symptoms, shortness of breath, cough etc.) before then, please call [phone].  If you test positive for Covid 19 in the 2 weeks post procedure, please call and report this information to [place].    If any biopsies were taken you will be contacted by phone or by letter within the next 1-3 weeks.  Please call us at [phone] if you have not heard about the biopsies in 3 weeks.    SIGNATURES/CONFIDENTIALITY: You and/or your care partner have signed paperwork which will be entered into your electronic medical record.  These signatures attest to the fact that  that the information above on your After Visit Summary has been reviewed and is understood.  Full responsibility of the confidentiality of this discharge information lies with you and/or your care-partner.

## 2022-04-01 ENCOUNTER — Telehealth: Payer: Self-pay

## 2022-04-01 NOTE — Telephone Encounter (Signed)
Follow up call, no answer 

## 2022-04-04 ENCOUNTER — Ambulatory Visit: Payer: Self-pay | Admitting: Obstetrics & Gynecology

## 2022-04-04 DIAGNOSIS — Z0289 Encounter for other administrative examinations: Secondary | ICD-10-CM

## 2022-04-06 ENCOUNTER — Encounter: Payer: Self-pay | Admitting: Gastroenterology

## 2022-04-06 ENCOUNTER — Other Ambulatory Visit: Payer: Self-pay

## 2022-04-06 DIAGNOSIS — A048 Other specified bacterial intestinal infections: Secondary | ICD-10-CM

## 2022-04-06 MED ORDER — CLARITHROMYCIN 500 MG PO TABS: 500.0000 mg | ORAL_TABLET | Freq: Two times a day (BID) | ORAL | 0 refills | Status: AC

## 2022-04-06 MED ORDER — METRONIDAZOLE 500 MG PO TABS: 500.0000 mg | ORAL_TABLET | Freq: Two times a day (BID) | ORAL | 0 refills | Status: AC

## 2022-04-06 MED ORDER — OMEPRAZOLE 40 MG PO CPDR: 40.0000 mg | DELAYED_RELEASE_CAPSULE | Freq: Two times a day (BID) | ORAL | 0 refills | Status: AC

## 2022-04-29 ENCOUNTER — Encounter: Payer: Managed Care, Other (non HMO) | Admitting: Internal Medicine

## 2022-05-03 ENCOUNTER — Encounter: Payer: Self-pay | Admitting: Family Medicine

## 2022-05-03 ENCOUNTER — Ambulatory Visit (INDEPENDENT_AMBULATORY_CARE_PROVIDER_SITE_OTHER): Payer: Managed Care, Other (non HMO) | Admitting: Family Medicine

## 2022-05-03 VITALS — BP 118/70 | HR 89 | Temp 97.2°F | Wt 169.0 lb

## 2022-05-03 DIAGNOSIS — R051 Acute cough: Secondary | ICD-10-CM

## 2022-05-03 DIAGNOSIS — J029 Acute pharyngitis, unspecified: Secondary | ICD-10-CM | POA: Diagnosis not present

## 2022-05-03 LAB — POC COVID19 BINAXNOW: SARS Coronavirus 2 Ag: NEGATIVE

## 2022-05-03 LAB — POCT INFLUENZA A/B
Influenza A, POC: NEGATIVE
Influenza B, POC: NEGATIVE

## 2022-05-03 NOTE — Progress Notes (Signed)
   Subjective:    Patient ID: Brandi Durham, female    DOB: 1976/05/16, 46 y.o.   MRN: [id number]  HPI She states that last Thursday she developed malaise which continued on Friday but over the weekend did fairly well until Monday when she did developed hoarse voice, slightly sore throat, slight cough but no fever, chills, chest congestion or earache.   Review of Systems     Objective:   Physical Exam Alert and in no distress. Tympanic membranes and canals are normal. Pharyngeal area is normal. Neck is supple without adenopathy or thyromegaly. Cardiac exam shows a regular sinus rhythm without murmurs or gallops. Lungs are clear to auscultation. COVID test is negative.  Also no influenza       Assessment & Plan:  Acute cough - Plan: POC COVID-19 BinaxNow, POCT Influenza A/B  Sore throat - Plan: POC COVID-19 BinaxNow, POCT Influenza A/B Recommend supportive care and call if further difficulties at the end of the week.

## 2022-06-03 ENCOUNTER — Other Ambulatory Visit: Payer: Self-pay | Admitting: Gastroenterology

## 2022-06-10 ENCOUNTER — Ambulatory Visit: Payer: Managed Care, Other (non HMO) | Admitting: Gastroenterology

## 2022-07-27 ENCOUNTER — Encounter: Payer: Self-pay | Admitting: Internal Medicine

## 2023-09-23 ENCOUNTER — Encounter: Payer: Self-pay | Admitting: Adult Health

## 2023-11-20 ENCOUNTER — Other Ambulatory Visit: Payer: Self-pay

## 2023-11-20 ENCOUNTER — Emergency Department (HOSPITAL_BASED_OUTPATIENT_CLINIC_OR_DEPARTMENT_OTHER): Payer: Self-pay

## 2023-11-20 ENCOUNTER — Emergency Department (HOSPITAL_BASED_OUTPATIENT_CLINIC_OR_DEPARTMENT_OTHER)
Admission: EM | Admit: 2023-11-20 | Discharge: 2023-11-20 | Disposition: A | Discharge status: Home / Self Care | Payer: Self-pay | Attending: Emergency Medicine | Admitting: Emergency Medicine

## 2023-11-20 ENCOUNTER — Encounter (HOSPITAL_BASED_OUTPATIENT_CLINIC_OR_DEPARTMENT_OTHER): Payer: Self-pay | Admitting: Emergency Medicine

## 2023-11-20 DIAGNOSIS — J0140 Acute pansinusitis, unspecified: Secondary | ICD-10-CM

## 2023-11-20 DIAGNOSIS — Z20822 Contact with and (suspected) exposure to covid-19: Secondary | ICD-10-CM | POA: Insufficient documentation

## 2023-11-20 DIAGNOSIS — Z79899 Other long term (current) drug therapy: Secondary | ICD-10-CM | POA: Insufficient documentation

## 2023-11-20 LAB — RESP PANEL BY RT-PCR (RSV, FLU A&B, COVID)  RVPGX2
Influenza A by PCR: NEGATIVE
Influenza B by PCR: NEGATIVE
Resp Syncytial Virus by PCR: NEGATIVE
SARS Coronavirus 2 by RT PCR: NEGATIVE

## 2023-11-20 MED ORDER — ALBUTEROL SULFATE HFA 108 (90 BASE) MCG/ACT IN AERS
2.0000 | INHALATION_SPRAY | Freq: Once | RESPIRATORY_TRACT | Status: AC
  Administered 2023-11-20: 2 via RESPIRATORY_TRACT
  Filled 2023-11-20: qty 6.7

## 2023-11-20 MED ORDER — BENZONATATE 100 MG PO CAPS
200.0000 mg | ORAL_CAPSULE | Freq: Once | ORAL | Status: AC
  Administered 2023-11-20: 200 mg via ORAL
  Filled 2023-11-20: qty 2

## 2023-11-20 MED ORDER — CEFUROXIME AXETIL 500 MG PO TABS: 500.0000 mg | ORAL_TABLET | Freq: Two times a day (BID) | ORAL | 0 refills | Status: AC

## 2023-11-20 MED ORDER — CEFUROXIME AXETIL 250 MG PO TABS
500.0000 mg | ORAL_TABLET | Freq: Once | ORAL | Status: AC
  Administered 2023-11-20: 500 mg via ORAL
  Filled 2023-11-20: qty 2

## 2023-11-20 MED ORDER — BENZONATATE 100 MG PO CAPS: 100.0000 mg | ORAL_CAPSULE | Freq: Three times a day (TID) | ORAL | 0 refills | Status: AC

## 2023-11-20 NOTE — Discharge Instructions (Addendum)
 You are seen in the emergency department today for concerns of nasal congestion.  Your symptoms appear to have started yesterday however given your recent infection about 2 weeks ago with no clear resolution of her cough, I am concerned that you may have had progression to a sinus infection.  Given the tenderness to your maxillary and frontal sinuses, I started you on antibiotic therapy.  Please take this as prescribed.  For any concerns or new or worsening symptoms, return to  the emergency department.

## 2023-11-20 NOTE — ED Triage Notes (Signed)
 Nasal/head congestion, runny nose, and cough onset yesterday. Denies known sick exposure. Denies fever.

## 2023-11-20 NOTE — ED Provider Notes (Signed)
 [place] [facility] Provider Note   CSN: [id number] Arrival date & time: 11/20/23  1638     History Chief Complaint  Patient presents with   Nasal Congestion    Brandi Durham is a 48 y.o. female.  Patient without significant medical history presents emergency department concerns of nasal congestion.  Endorse that she is having nasal and head congestion, runny nose, and has had a cough off and on for the last 2 weeks.  States that she had a viral type infection about 2 weeks ago which did appear to improve somewhat.  She states that her cough never fully resolved.  No recent fever, chills, body aches.  HPI     Home Medications Prior to Admission medications   Medication Sig Start Date End Date Taking? Authorizing Provider  benzonatate (TESSALON) 100 MG capsule Take 1 capsule (100 mg total) by mouth every 8 (eight) hours. 11/20/23  Yes Maryanna Shape A, PA-C  cefUROXime (CEFTIN) 500 MG tablet Take 1 tablet (500 mg total) by mouth 2 (two) times daily with a meal for 7 days. 11/20/23 11/27/23 Yes Smitty Knudsen, PA-C  ascorbic acid (VITAMIN C) 500 MG tablet Take 500 mg by mouth daily. Patient not taking: Reported on 03/11/2022    [provider]  esomeprazole (NEXIUM) 40 MG capsule TAKE 1 CAPSULE (40 MG TOTAL) BY MOUTH DAILY. 06/03/22   Mansouraty, Netty Starring., MD  gabapentin (NEURONTIN) 300 MG capsule Take 1 capsule on day one, then 1 capsule on day 2, 3 capsules on day 3 and then continue taking 3 capsules per day after that 03/13/22   Janell Quiet, PA-C  ibuprofen (ADVIL) 800 MG tablet Take 1 tablet (800 mg total) by mouth every 8 (eight) hours as needed for moderate pain. 02/13/22   Franne Forts, DO  methylPREDNISolone (MEDROL DOSEPAK) 4 MG TBPK tablet Take as directed Patient not taking: Reported on 05/03/2022 03/11/22   Jake Shark, PA-C  omeprazole (PRILOSEC) 40 MG capsule Take 1 capsule (40 mg total) by mouth 2 (two) times daily  before a meal. For 14 days then daily for another month 04/06/22 05/21/22  Mansouraty, Netty Starring., MD  oxyCODONE-acetaminophen (PERCOCET/ROXICET) 5-325 MG tablet Take 1 tablet by mouth every 6 (six) hours as needed for severe pain. Patient not taking: Reported on 05/03/2022 03/13/22   Janell Quiet, PA-C      Allergies    Doxycycline, Elemental sulfur, and Penicillins    Review of Systems   Review of Systems  HENT:  Positive for sinus pressure.   All other systems reviewed and are negative.   Physical Exam Updated Vital Signs BP 122/78 (BP Location: Left Arm)   Pulse 75   Temp 97.9 F (36.6 C) (Oral)   Resp 17   Ht 5\' 2"  (1.575 m)   Wt 72.6 kg   LMP 11/15/2019   SpO2 99%   BMI 29.26 kg/m  Physical Exam Vitals and nursing note reviewed.  Constitutional:      General: She is not in acute distress.    Appearance: She is well-developed.  HENT:     Head: Normocephalic and atraumatic.     Comments: Maxillary and frontal sinus tenderness to palpation    Right Ear: Tympanic membrane, ear canal and external ear normal.     Left Ear: Tympanic membrane, ear canal and external ear normal.  Eyes:     Conjunctiva/sclera: Conjunctivae normal.  Cardiovascular:     Rate  and Rhythm: Normal rate and regular rhythm.     Heart sounds: No murmur heard. Pulmonary:     Effort: Pulmonary effort is normal. No respiratory distress.     Breath sounds: Normal breath sounds.  Abdominal:     Palpations: Abdomen is soft.     Tenderness: There is no abdominal tenderness.  Musculoskeletal:        General: No swelling.     Cervical back: Neck supple.  Skin:    General: Skin is warm and dry.     Capillary Refill: Capillary refill takes less than 2 seconds.  Neurological:     Mental Status: She is alert.  Psychiatric:        Mood and Affect: Mood normal.     ED Results / Procedures / Treatments   Labs (all labs ordered are listed, but only abnormal results are displayed) Labs Reviewed  RESP  PANEL BY RT-PCR (RSV, FLU A&B, COVID)  [id number]    EKG None  Radiology DG Chest 2 View Result Date: 11/20/2023 CLINICAL DATA:  Wheezing and cough. EXAM: CHEST - 2 VIEW COMPARISON:  Chest radiograph dated 12/19/2016. FINDINGS: No focal consolidation, pleural effusion, pneumothorax. The cardiac silhouette is within normal limits. No acute osseous pathology. IMPRESSION: No active cardiopulmonary disease. Electronically Signed   By: Elgie Collard M.D.   On: 11/20/2023 20:19    Procedures Procedures    Medications Ordered in ED Medications  albuterol (VENTOLIN HFA) 108 (90 Base) MCG/ACT inhaler 2 puff (2 puffs Inhalation Given 11/20/23 1659)  cefUROXime (CEFTIN) tablet 500 mg (500 mg Oral Given 11/20/23 2220)  benzonatate (TESSALON) capsule 200 mg (200 mg Oral Given 11/20/23 2220)    ED Course/ Medical Decision Making/ A&P                                 Medical Decision Making Amount and/or Complexity of Data Reviewed Radiology: ordered.  Risk Prescription drug management.   This patient presents to the ED for concern of nasal congestion.  Differential diagnosis includes COVID-19, influenza, viral URI, sinusitis   Lab Tests:  I Ordered, and personally interpreted labs.  The pertinent results include: Respiratory panel negative for COVID-19, influenza and RSV   Imaging Studies ordered:  I ordered imaging studies including chest x-ray I independently visualized and interpreted imaging which showed negative for any acute findings I agree with the radiologist interpretation   Medicines ordered and prescription drug management:  I ordered medication including albuterol, cefuroxime, Tessalon for coughing, sinusitis Reevaluation of the patient after these medicines showed that the patient improved I have reviewed the patients home medicines and have made adjustments as needed   Problem List / ED Course:  Patient presents the emergency department today with concerns of  nasal congestion.  Reports this been ongoing for about 1 day.  Had a viral type infection about 2 weeks ago without clear resolution of symptoms.  Is concerned that her symptoms have worsened to now progressed to a sinus type infection.  Does not feel that she gets sinus infections recurrently and sinus sometimes get aggravated symptoms due to seasonal allergies.  She states that she is taking her routine seasonal allergy medications without improvement. Physical exam does show some concerns for sinusitis with maxillary and frontal sinus pain and pressure.  Patient is having productive discharge from the nose.  I am concerned for sinusitis and will start patient on antibiotic therapy.  Patient  does have allergies to penicillins and doxycycline.  For this reason, we will start patient on cefuroxime.  Also send patient with a prescription for Tessalon as she may be experiencing a postviral cough.  Return precautions discussed.  Encourage patient to follow-up with PCP.  Patient otherwise stable discharge home for outpatient follow-up.   Final Clinical Impression(s) / ED Diagnoses Final diagnoses:  Acute non-recurrent pansinusitis    Rx / DC Orders ED Discharge Orders          Ordered    cefUROXime (CEFTIN) 500 MG tablet  2 times daily with meals        11/20/23 2208    benzonatate (TESSALON) 100 MG capsule  Every 8 hours        11/20/23 2208              Smitty Knudsen, PA-C 11/20/23 2330    Tegeler, Canary Brim, MD 11/21/23 0000

## 2024-05-03 ENCOUNTER — Emergency Department (HOSPITAL_BASED_OUTPATIENT_CLINIC_OR_DEPARTMENT_OTHER): Payer: Self-pay

## 2024-05-03 ENCOUNTER — Encounter (HOSPITAL_BASED_OUTPATIENT_CLINIC_OR_DEPARTMENT_OTHER): Payer: Self-pay | Admitting: Emergency Medicine

## 2024-05-03 ENCOUNTER — Emergency Department (HOSPITAL_BASED_OUTPATIENT_CLINIC_OR_DEPARTMENT_OTHER)
Admission: EM | Admit: 2024-05-03 | Discharge: 2024-05-03 | Disposition: A | Discharge status: Home / Self Care | Payer: Self-pay | Attending: Emergency Medicine | Admitting: Emergency Medicine

## 2024-05-03 ENCOUNTER — Other Ambulatory Visit: Payer: Self-pay

## 2024-05-03 DIAGNOSIS — M25571 Pain in right ankle and joints of right foot: Secondary | ICD-10-CM | POA: Insufficient documentation

## 2024-05-03 MED ORDER — PREDNISONE 10 MG PO TABS
ORAL_TABLET | ORAL | 0 refills | Status: AC
Start: 2024-05-03 — End: 2024-05-10

## 2024-05-03 MED ORDER — NAPROXEN 250 MG PO TABS
500.0000 mg | ORAL_TABLET | Freq: Once | ORAL | Status: AC
  Administered 2024-05-03: 500 mg via ORAL
  Filled 2024-05-03: qty 2

## 2024-05-03 NOTE — ED Triage Notes (Signed)
 Pt c/o R foot and ankle pain x 5 days. Worse today with swelling. Denies known injury.

## 2024-05-03 NOTE — Discharge Instructions (Addendum)
 The X-ray of your right ankle and foot do not show any fracture or dislocation.  It is possible you have inflammation of a tendon which is causing pain and swelling. Gout can also cause acute pain and swelling in joints, although the ankle joint is not the most common spot for this to occur. We will treat you for both potential etiologies for your pain.    You have been started on Naproxen  here in the ER for your gout. Please continue to take 500mg  naproxen  (Aleve ) every 12 hours as needed for pain.  This is an over-the-counter medication.  Typically, pain should start to resolve within about 7-10 days. Discontinue taking this medication when your pain has resolved.  You have been prescribed prednisone . Please take this medication as prescribed for the next 7 days (40mg  on days 1 and 2, 30mg  on days 3 and 4, 20mg  on days 5 and 6, 10mg  on day 7). Take this medication in the morning, as taking it at night may make it hard to sleep. If you are a diabetic, please monitor your blood sugars closely on this medication, as it can cause your blood sugar to rise. If your blood sugars rise above 400 and you begin to feel unwell, stop taking the medication and go to the ER.  Maintaining a healthy weight and healthy blood pressure can help reduce the risk of flares. Limit consumption of alcohol, sweetened beverages (like sodas and sweet tea), red meats, organ meats (liver), and shellfish. Consumption of these foods can trigger flares.   If you have frequent flares, please schedule an appointment to talk to your PCP.   Return to the ER if your symptoms do not start to improve within the next 48 hours, you develop fever or chills, any other new or concerning symptoms

## 2024-05-03 NOTE — ED Provider Notes (Signed)
 [place] [facility] [place] Provider Note   CSN: [id number] Arrival date & time: 05/03/24  8363     Patient presents with: Ankle Pain   Brandi Durham is a 48 y.o. female with no significant past medical history presents with concern for swelling and pain to her right ankle that started 5 days ago.  No known injury.  States she does work on her feet a lot and is unsure if this caused her ankle to swell.  Reports pain when moving the ankle and when walking.  Denies any numbness in her right foot.  Denies any calf pain or swelling.    Ankle Pain      Prior to Admission medications   Medication Sig Start Date End Date Taking? Authorizing Provider  predniSONE  (DELTASONE ) 10 MG tablet Take 4 tablets (40 mg total) by mouth daily for 2 days, THEN 3 tablets (30 mg total) daily for 2 days, THEN 2 tablets (20 mg total) daily for 2 days, THEN 1 tablet (10 mg total) daily for 1 day. 05/03/24 05/10/24 Yes Veta Palma, PA-C  ascorbic acid (VITAMIN C) 500 MG tablet Take 500 mg by mouth daily. Patient not taking: Reported on 03/11/2022    [provider]  benzonatate  (TESSALON ) 100 MG capsule Take 1 capsule (100 mg total) by mouth every 8 (eight) hours. 11/20/23   Zelaya, Oscar A, PA-C  esomeprazole  (NEXIUM ) 40 MG capsule TAKE 1 CAPSULE (40 MG TOTAL) BY MOUTH DAILY. 06/03/22   Mansouraty, Aloha Raddle., MD  omeprazole  (PRILOSEC) 40 MG capsule Take 1 capsule (40 mg total) by mouth 2 (two) times daily before a meal. For 14 days then daily for another month 04/06/22 05/21/22  Mansouraty, Aloha Raddle., MD    Allergies: Doxycycline , Elemental sulfur, and Penicillins    Review of Systems  Musculoskeletal:        Right ankle pain    Updated Vital Signs BP (!) 148/93 (BP Location: Left Arm)   Pulse 85   Temp 98 F (36.7 C) (Oral)   Resp 18   Ht 5' 2 (1.575 m)   Wt 81.6 kg   LMP 11/15/2019   SpO2 100%   BMI 32.92 kg/m   Physical Exam Vitals and nursing note  reviewed.  Constitutional:      Appearance: Normal appearance.  HENT:     Head: Atraumatic.  Cardiovascular:     Comments: 2+ pedal pulses bilaterally Pulmonary:     Effort: Pulmonary effort is normal.  Musculoskeletal:     Comments: Right lower extremity:  General Diffuse edema around the prominent around the lateral aspect of the ankle.  No erythema, contusions, open wounds.  No warmth to touch  Palpation Tenderness diffusely over the soft tissues of the right ankle, particularly over the lateral aspect of the right ankle. Nontender along the tibia and fibula, and specifically non-tender over the lateral and medial malleolus Non-tender along the ATFL, CFL, PTFL, achilles tendon Nontender along the first through fifth metatarsals and phalanges Calf soft and non-tender to palpation  ROM Full ankle flexion, extension, inversion and eversion  Sensation: Sensation intact throughout the lower extremity   Neurological:     General: No focal deficit present.     Mental Status: She is alert.  Psychiatric:        Mood and Affect: Mood normal.        Behavior: Behavior normal.     (all labs ordered are listed, but only abnormal results are displayed) Labs  Reviewed - No data to display  EKG: None  Radiology: DG Foot Complete Right Result Date: 05/03/2024 CLINICAL DATA:  Atraumatic right foot and right ankle pain x4 days. EXAM: RIGHT FOOT COMPLETE - 3+ VIEW COMPARISON:  None Available. FINDINGS: There is no evidence of fracture or dislocation. A small plantar calcaneal spur is seen. There is no evidence of arthropathy or other focal bone abnormality. Mild to moderate severity soft tissue swelling is seen within the region of the right ankle. IMPRESSION: Mild to moderate severity soft tissue swelling within the region of the right ankle. Electronically Signed   By: Suzen Dials M.D.   On: 05/03/2024 17:31   DG Ankle Complete Right Result Date: 05/03/2024 CLINICAL DATA:   Atraumatic right foot and right ankle pain x4 days. EXAM: RIGHT ANKLE - COMPLETE 3+ VIEW COMPARISON:  None Available. FINDINGS: There is no evidence of an acute fracture or dislocation. A chronic deformity is seen along the distal tip of the right lateral malleolus. There is no evidence of arthropathy or other focal bone abnormality. Mild to moderate severity diffuse soft tissue swelling is noted. IMPRESSION: Mild to moderate severity diffuse soft tissue swelling without an acute osseous abnormality. Electronically Signed   By: Suzen Dials M.D.   On: 05/03/2024 17:30     Procedures   Medications Ordered in the ED  naproxen  (NAPROSYN ) tablet 500 mg (500 mg Oral Given 05/03/24 1801)                                    Medical Decision Making Amount and/or Complexity of Data Reviewed Radiology: ordered.     Differential diagnosis includes but is not limited to fracture, dislocation, ankle sprain, septic arthritis, gout, osteoarthritis  ED Course:  Upon initial evaluation, patient is well-appearing, no acute distress.  Reporting pain to the right ankle.  She does have diffuse edema along the right ankle, most prominent along the lateral aspect the ankle.  She denies any injuries, no bony tenderness to palpation of the tibia, fibula, 1st through 5th metatarsals or phalanges.  X-ray of the right ankle and right foot without any fracture or dislocation noted, low concern for occult fracture.  Although she does have tenderness over the lateral aspect of the ankle, no known injury, lower concern for ankle sprain.  She also has pain on the medial and anterior aspect of the ankle.  No erythema or warmth to touch, has full range of motion, no overlying wounds, no concern for septic arthritis.  She does not have any calf swelling or tenderness to palpation, low concern for DVT.  Given she is on her feet a lot at work, question if this could be an overuse injury such as a tendinitis.  However, also  question if this could be gout given diffuse pain around the ankle.  Will treat for both these pathologies with course of prednisone  and naproxen  at home.  She declines first dose of prednisone  here since I did discuss with her it may keep her up at night.  She will take her first dose tomorrow morning.  She received first dose of naproxen  here.  Patient stable and appropriate for discharge home.  Imaging Studies ordered: I ordered imaging studies including x-ray right foot, x-ray right ankle I independently visualized the imaging with scope of interpretation limited to determining acute life threatening conditions related to emergency care. Imaging showed mild to moderate severity  soft tissue swelling within the region of the right ankle.  No evidence of fracture or dislocation. I agree with the radiologist interpretation    Medications Given: Naproxen    Impression: Right ankle pain  Disposition:  The patient was discharged home with instructions to take course of prednisone  as prescribed.  May take naproxen  as needed for pain.  Follow-up with PCP if pain not improving within the next 5 days. Return precautions given.     This chart was dictated using voice recognition software, Dragon. Despite the best efforts of this provider to proofread and correct errors, errors may still occur which can change documentation meaning.       Final diagnoses:  Acute right ankle pain    ED Discharge Orders          Ordered    predniSONE  (DELTASONE ) 10 MG tablet  Daily        05/03/24 1755               Veta Palma, PA-C 05/03/24 1803    Pamella Ozell LABOR, DO 05/08/24 1607
